# Patient Record
Sex: Male | Born: 1950 | Race: White | Hispanic: No | Marital: Single | Smoking: Former smoker
Health system: Southern US, Community
[De-identification: ages and names within clinical notes are randomized; demographics above are authoritative.]

## PROBLEM LIST (undated history)

## (undated) DIAGNOSIS — J449 Chronic obstructive pulmonary disease, unspecified: Secondary | ICD-10-CM

## (undated) HISTORY — PX: EXTERNAL EAR SURGERY: SHX627

## (undated) HISTORY — PX: TONSILLECTOMY: SUR1361

---

## 1998-12-04 DIAGNOSIS — F1021 Alcohol dependence, in remission: Secondary | ICD-10-CM | POA: Insufficient documentation

## 2014-03-14 ENCOUNTER — Inpatient Hospital Stay: Payer: Self-pay | Admitting: Internal Medicine

## 2014-03-14 LAB — BASIC METABOLIC PANEL
Anion Gap: 9 (ref 7–16)
BUN: 11 mg/dL (ref 7–18)
CO2: 28 mmol/L (ref 21–32)
Calcium, Total: 8.6 mg/dL (ref 8.5–10.1)
Chloride: 103 mmol/L (ref 98–107)
Creatinine: 0.93 mg/dL (ref 0.60–1.30)
EGFR (African American): >60
Glucose: 124 mg/dL — ABNORMAL HIGH (ref 65–99)
OSMOLALITY: 280 (ref 275–301)
Potassium: 3.6 mmol/L (ref 3.5–5.1)
Sodium: 140 mmol/L (ref 136–145)

## 2014-03-14 LAB — CBC
HCT: 46.1 % (ref 40.0–52.0)
HGB: 15.3 g/dL (ref 13.0–18.0)
MCH: 31.7 pg (ref 26.0–34.0)
MCHC: 33.1 g/dL (ref 32.0–36.0)
MCV: 96 fL (ref 80–100)
Platelet: 207 10*3/uL (ref 150–440)
RBC: 4.82 10*6/uL (ref 4.40–5.90)
RDW: 14.2 % (ref 11.5–14.5)
WBC: 8.2 10*3/uL (ref 3.8–10.6)

## 2014-03-14 LAB — INFLUENZA A,B,H1N1 - PCR (ARMC)
H1N1FLUPCR: NOT DETECTED
INFLAPCR: NEGATIVE
INFLBPCR: NEGATIVE

## 2014-03-14 LAB — D-DIMER(ARMC): D-Dimer: 367 ng/ml

## 2014-03-14 LAB — TROPONIN I

## 2014-03-15 LAB — BASIC METABOLIC PANEL
Anion Gap: 5 — ABNORMAL LOW (ref 7–16)
BUN: 12 mg/dL (ref 7–18)
CALCIUM: 8.9 mg/dL (ref 8.5–10.1)
Chloride: 104 mmol/L (ref 98–107)
Co2: 29 mmol/L (ref 21–32)
Creatinine: 0.92 mg/dL (ref 0.60–1.30)
EGFR (African American): >60
Glucose: 148 mg/dL — ABNORMAL HIGH (ref 65–99)
Osmolality: 278 (ref 275–301)
Potassium: 4.5 mmol/L (ref 3.5–5.1)
Sodium: 138 mmol/L (ref 136–145)

## 2014-03-15 LAB — CBC WITH DIFFERENTIAL/PLATELET
BASOS ABS: 0 10*3/uL (ref 0.0–0.1)
Basophil %: 0.1 %
EOS ABS: 0 10*3/uL (ref 0.0–0.7)
EOS PCT: 0 %
HCT: 44.3 % (ref 40.0–52.0)
HGB: 14.7 g/dL (ref 13.0–18.0)
LYMPHS PCT: 7.7 %
Lymphocyte #: 0.7 10*3/uL — ABNORMAL LOW (ref 1.0–3.6)
MCH: 31.8 pg (ref 26.0–34.0)
MCHC: 33.1 g/dL (ref 32.0–36.0)
MCV: 96 fL (ref 80–100)
MONO ABS: 0.4 x10 3/mm (ref 0.2–1.0)
MONOS PCT: 4.8 %
Neutrophil #: 7.8 10*3/uL — ABNORMAL HIGH (ref 1.4–6.5)
Neutrophil %: 87.4 %
Platelet: 210 10*3/uL (ref 150–440)
RBC: 4.62 10*6/uL (ref 4.40–5.90)
RDW: 14.7 % — ABNORMAL HIGH (ref 11.5–14.5)
WBC: 8.9 10*3/uL (ref 3.8–10.6)

## 2014-03-19 LAB — CULTURE, BLOOD (SINGLE)

## 2014-07-11 NOTE — H&P (Signed)
PATIENT NAME:  Gene Kim, Gene Kim MR#:  [id number] DATE OF BIRTH:  1950-08-27  DATE OF ADMISSION:  03/14/2014  PRIMARY CARE PROVIDER: [facility].   EMERGENCY DEPARTMENT REFERRING: Garnette Scheuermann, MD   CHIEF COMPLAINT: Shortness of breath, wheezing.   HISTORY OF PRESENT ILLNESS: The patient is a 64 year old white male with history of asthma who reports that his coworkers were sick with some upper respiratory symptoms a few days prior. Subsequently, he started developing headache, congestion and drainage, and then started having shortness of breath and wheezing since this morning. The patient continued to have significant shortness of breath and wheezing, came to the ED, received multiple treatments with nebulizers and has not improved. He continues to have shortness of breath. He does have history of asthma. He states that he uses the inhalers once in a while, but not on a daily basis. He also has a nebulizer machine, which he used a few times without any improvement in his symptoms.   PAST MEDICAL HISTORY: History of asthma since childhood.   PAST SURGICAL HISTORY: Left ear surgery.   ALLERGIES: None.   MEDICATIONS: He is on ProAir 2 puffs q. 6 as needed, as well as albuterol ipratropium nebulizers q. 6 p.r.n.   SOCIAL HISTORY: History of smoking, quit multiple years ago. He states that he only smoked heavly  for 5 years on a daily basis. Drinks occasionally, no drug use.   FAMILY HISTORY: Positive for hypertension.   REVIEW OF SYSTEMS: CONSTITUTIONAL: Complains of some fever. No weakness. No weight loss. No weight gain.  EYES: No blurred or double vision. No redness. No inflammation.  EARS, NOSE, AND THROAT: No tinnitus. No ear pain. No hearing loss. Complains of sinus pain.  RESPIRATORY: Complains of cough, wheezing. Has a history of asthma.  CARDIOVASCULAR: Denies any chest pain, orthopnea, edema, arrhythmia.  GASTROINTESTINAL: No nausea, vomiting, diarrhea. No abdominal  pain. No hematemesis. No melena. GENITOURINARY: Denies any dysuria, hematuria, renal colic or frequency.  ENDOCRINE: Denies any polyuria, nocturia or thyroid problems.  HEMATOLOGIC AND LYMPHATIC: Denies anemia, easy bruisability or bleeding.  SKIN: No acne. No rash.  MUSCULOSKELETAL: Denies any pain in the neck, back or shoulder. NEUROLOGICAL: No numbness, CVA, TIA or seizures.  PSYCHIATRIC: No anxiety, insomnia or ADD.   PHYSICAL EXAMINATION:  VITAL SIGNS: Temperature 98.6, heart rate 121, respirations 20, blood pressure 152/83.  GENERAL: The patient is a well-developed, well-nourished male in mild respiratory distress. HEENT: Head atraumatic, normocephalic. Pupils equally round and reactive to light and accommodation. LUNGS: Bilateral wheezing throughout both lungs with accessory muscle usage.  CARDIOVASCULAR: Tachycardic. No murmurs, rubs, clicks or gallops.  ABDOMEN: Soft, nontender, nondistended. Positive bowel sounds x 4.  EXTREMITIES: No clubbing, cyanosis, edema.  SKIN: No rash.  LYMPH NODES: Nonpalpable.  MUSCULOSKELETAL: There is no erythema or swelling.  VASCULAR: Good DP/PT pulses.  PSYCHIATRIC: Not anxious or depressed.  NEUROLOGIC: Awake, alert, oriented x 3. No focal deficits.   EVALUATIONS: Chest x-ray shows no acute abnormality. Glucose 124, BUN 11, creatinine 0.93, sodium 140, potassium 3.6, chloride 103, CO2 of 28, calcium 8.6. Troponin less than 0.02. WBC 8.2, hemoglobin 15.3, platelet count 207,000.   ASSESSMENT AND PLAN: The patient is a 64 year old white male with history of asthma who presents with cough, shortness of breath.  1.  Acute respiratory failure due to acute asthma exacerbation. At this time, I will treat him with Xopenex in light of his heart rate being high. I will place him on steroids, IV  antibiotics, antitussives. Will monitor his respiratory status.  2.  Acute bronchitis. We will give him IV antibiotics, antitussives. We will also check an  influenza.  3.  Miscellaneous. He will be on Lovenox for deep venous thrombosis prophylaxis.   TIME SPENT: Fifty minutes.    ____________________________ Lafonda Mosses Posey Pronto, MD shp:TT D: 03/14/2014 13:64:38 ET T: 03/14/2014 21:15:58 ET JOB#: [id number]  cc: Jahnyla Parrillo H. Posey Pronto, MD, <Dictator> Alric Seton MD ELECTRONICALLY SIGNED 03/18/2014 12:22

## 2014-07-11 NOTE — Discharge Summary (Signed)
PATIENT NAME:  Gene Kim, Gene Kim MR#:  [id number] DATE OF BIRTH:  May 01, 1950  DATE OF ADMISSION:  03/14/2014 DATE OF DISCHARGE:  03/15/2014  ADMITTING DIAGNOSES:  1.  Shortness of breath.  2.  Cough.   DISCHARGE DIAGNOSES:  1.  Acute respiratory failure due to status asthmaticus. Now patient's symptoms improved.  2.  Acute bronchitis, possible bacterial in nature.   CONSULTANTS: None.   PERTINENT LABORATORY DATA, EVALUATIONS: Chest x-ray showed no acute abnormality. Labs: Glucose 124, BUN 11, creatinine 0.93, sodium 140, potassium 3.6, chloride was 103, CO2 was 28. Troponin less than 0.02. WBC 8.2, hemoglobin was 15.3, platelet count was 207,000. Blood cultures: No growth. Influenza A and B were negative.   HOSPITAL COURSE: Please refer to H and P done by me on admission. The patient is a 64 year old white male with history of asthma, presented with upper respiratory symptoms ongoing for a few days with shortness of breath and wheezing. The patient came to the ED and received multiple doses of nebulizers without any significant improvement in his wheezing and shortness of breath. Therefore, we were asked to admit the patient. The patient was placed on nebulizers and steroids and antibiotics with significant improvement of his symptoms by day 2. He also had cough and symptoms consistent with acute bronchitis. The patient was treated with antibiotics. At this time he is doing much better and very anxious to go home.   DISCHARGE MEDICATIONS: ProAir 2 puffs q. 6 hours p.r.n., prednisone taper starting at 60 mg 10 mg until complete, guaifenesin 600 mg 1 tab p.o. b.i.d., codeine/guaifenesin 15 mL q. 6 hours p.r.n. for cough, fluticasone nasally 2 sprays daily, Levaquin 500 mg 1 tab p.o. q. 24 hours x 4 days, ipratropium 4 times a day as needed.   DISCHARGE DIET: Regular.   ACTIVITY: As tolerated.   FOLLOWUP: Follow with primary MD in 1 to 2 weeks.   TIME SPENT: 35  minutes.   ____________________________ Lafonda Mosses Posey Pronto, MD shp:ts D: 03/16/2014 20:47:00 ET T: 03/16/2014 21:04:15 ET JOB#: [id number]  cc: Jedd Schulenburg H. Posey Pronto, MD, <Dictator> Alric Seton MD ELECTRONICALLY SIGNED 03/18/2014 12:23

## 2014-08-27 ENCOUNTER — Encounter: Payer: Self-pay | Admitting: Emergency Medicine

## 2014-08-27 ENCOUNTER — Inpatient Hospital Stay
Admission: EM | Admit: 2014-08-27 | Discharge: 2014-08-28 | DRG: 189 | Disposition: A | Discharge status: Home / Self Care | Payer: BLUE CROSS/BLUE SHIELD | Attending: Internal Medicine | Admitting: Internal Medicine

## 2014-08-27 ENCOUNTER — Emergency Department: Payer: BLUE CROSS/BLUE SHIELD

## 2014-08-27 DIAGNOSIS — Z8249 Family history of ischemic heart disease and other diseases of the circulatory system: Secondary | ICD-10-CM | POA: Diagnosis not present

## 2014-08-27 DIAGNOSIS — W06XXXA Fall from bed, initial encounter: Secondary | ICD-10-CM | POA: Diagnosis present

## 2014-08-27 DIAGNOSIS — F17210 Nicotine dependence, cigarettes, uncomplicated: Secondary | ICD-10-CM | POA: Diagnosis present

## 2014-08-27 DIAGNOSIS — Z716 Tobacco abuse counseling: Secondary | ICD-10-CM | POA: Diagnosis present

## 2014-08-27 DIAGNOSIS — R55 Syncope and collapse: Secondary | ICD-10-CM | POA: Diagnosis present

## 2014-08-27 DIAGNOSIS — J441 Chronic obstructive pulmonary disease with (acute) exacerbation: Secondary | ICD-10-CM

## 2014-08-27 DIAGNOSIS — F10920 Alcohol use, unspecified with intoxication, uncomplicated: Secondary | ICD-10-CM

## 2014-08-27 DIAGNOSIS — F10129 Alcohol abuse with intoxication, unspecified: Secondary | ICD-10-CM | POA: Diagnosis present

## 2014-08-27 DIAGNOSIS — Z9889 Other specified postprocedural states: Secondary | ICD-10-CM | POA: Diagnosis not present

## 2014-08-27 DIAGNOSIS — H9190 Unspecified hearing loss, unspecified ear: Secondary | ICD-10-CM | POA: Diagnosis present

## 2014-08-27 DIAGNOSIS — J9601 Acute respiratory failure with hypoxia: Secondary | ICD-10-CM | POA: Diagnosis present

## 2014-08-27 DIAGNOSIS — R0902 Hypoxemia: Secondary | ICD-10-CM

## 2014-08-27 HISTORY — DX: Chronic obstructive pulmonary disease, unspecified: J44.9

## 2014-08-27 LAB — BLOOD GAS, ARTERIAL
Acid-base deficit: 5 mmol/L — ABNORMAL HIGH (ref 0.0–2.0)
BICARBONATE: 21.1 meq/L (ref 21.0–28.0)
Delivery systems: POSITIVE
EXPIRATORY PAP: 5
FIO2: 0.45 %
INSPIRATORY PAP: 10
O2 Saturation: 89.8 %
PATIENT TEMPERATURE: 37
PH ART: 7.31 — AB (ref 7.350–7.450)
pCO2 arterial: 42 mmHg (ref 32.0–48.0)
pO2, Arterial: 64 mmHg — ABNORMAL LOW (ref 83.0–108.0)

## 2014-08-27 LAB — CBC WITH DIFFERENTIAL/PLATELET
BASOS PCT: 0 %
Basophils Absolute: 0 10*3/uL (ref 0–0.1)
Eosinophils Absolute: 0.8 10*3/uL — ABNORMAL HIGH (ref 0–0.7)
Eosinophils Relative: 10 %
HEMATOCRIT: 47.5 % (ref 40.0–52.0)
HEMOGLOBIN: 15.6 g/dL (ref 13.0–18.0)
LYMPHS ABS: 4.1 10*3/uL — AB (ref 1.0–3.6)
LYMPHS PCT: 48 %
MCH: 31.6 pg (ref 26.0–34.0)
MCHC: 32.9 g/dL (ref 32.0–36.0)
MCV: 96 fL (ref 80.0–100.0)
MONO ABS: 0.7 10*3/uL (ref 0.2–1.0)
MONOS PCT: 8 %
Neutro Abs: 2.9 10*3/uL (ref 1.4–6.5)
Neutrophils Relative %: 34 %
Platelets: 179 10*3/uL (ref 150–440)
RBC: 4.95 MIL/uL (ref 4.40–5.90)
RDW: 13.8 % (ref 11.5–14.5)
WBC: 8.5 10*3/uL (ref 3.8–10.6)

## 2014-08-27 LAB — BASIC METABOLIC PANEL
ANION GAP: 10 (ref 5–15)
BUN: 14 mg/dL (ref 6–20)
CO2: 26 mmol/L (ref 22–32)
CREATININE: 1.03 mg/dL (ref 0.61–1.24)
Calcium: 8.7 mg/dL — ABNORMAL LOW (ref 8.9–10.3)
Chloride: 104 mmol/L (ref 101–111)
GFR calc Af Amer: >60 mL/min (ref >60)
GFR calc non Af Amer: >60 mL/min (ref >60)
GLUCOSE: 152 mg/dL — AB (ref 65–99)
Potassium: 4.3 mmol/L (ref 3.5–5.1)
Sodium: 140 mmol/L (ref 135–145)

## 2014-08-27 LAB — TROPONIN I: Troponin I: <0.03 ng/mL (ref <0.031)

## 2014-08-27 LAB — ETHANOL: ALCOHOL ETHYL (B): 227 mg/dL — AB (ref <5)

## 2014-08-27 LAB — MRSA PCR SCREENING: MRSA by PCR: POSITIVE — AB

## 2014-08-27 LAB — GLUCOSE, CAPILLARY: GLUCOSE-CAPILLARY: 129 mg/dL — AB (ref 65–99)

## 2014-08-27 MED ORDER — IPRATROPIUM-ALBUTEROL 0.5-2.5 (3) MG/3ML IN SOLN
3.0000 mL | Freq: Once | RESPIRATORY_TRACT | Status: AC
  Administered 2014-08-27: 3 mL via RESPIRATORY_TRACT

## 2014-08-27 MED ORDER — ACETAMINOPHEN 325 MG PO TABS
650.0000 mg | ORAL_TABLET | Freq: Four times a day (QID) | ORAL | Status: DC | PRN
Start: 2014-08-27 — End: 2014-08-28

## 2014-08-27 MED ORDER — MESALAMINE 1000 MG RE SUPP
1000.0000 mg | Freq: Every day | RECTAL | Status: DC
  Filled 2014-08-27 (×3): qty 1

## 2014-08-27 MED ORDER — IPRATROPIUM-ALBUTEROL 0.5-2.5 (3) MG/3ML IN SOLN
3.0000 mL | Freq: Four times a day (QID) | RESPIRATORY_TRACT | Status: DC
  Administered 2014-08-27 – 2014-08-28 (×3): 3 mL via RESPIRATORY_TRACT
  Filled 2014-08-27 (×3): qty 3

## 2014-08-27 MED ORDER — CHLORHEXIDINE GLUCONATE CLOTH 2 % EX PADS
6.0000 | MEDICATED_PAD | Freq: Every day | CUTANEOUS | Status: DC
  Administered 2014-08-28: 6 via TOPICAL

## 2014-08-27 MED ORDER — IPRATROPIUM BROMIDE 0.02 % IN SOLN
0.5000 mg | Freq: Four times a day (QID) | RESPIRATORY_TRACT | Status: DC
  Administered 2014-08-27: 0.5 mg via RESPIRATORY_TRACT
  Filled 2014-08-27: qty 2.5

## 2014-08-27 MED ORDER — ACETAMINOPHEN 650 MG RE SUPP
650.0000 mg | Freq: Four times a day (QID) | RECTAL | Status: DC | PRN
Start: 2014-08-27 — End: 2014-08-28

## 2014-08-27 MED ORDER — IPRATROPIUM-ALBUTEROL 0.5-2.5 (3) MG/3ML IN SOLN
RESPIRATORY_TRACT | Status: AC
  Filled 2014-08-27: qty 3

## 2014-08-27 MED ORDER — ENOXAPARIN SODIUM 40 MG/0.4ML ~~LOC~~ SOLN
40.0000 mg | SUBCUTANEOUS | Status: DC
  Administered 2014-08-27: 40 mg via SUBCUTANEOUS
  Filled 2014-08-27: qty 0.4

## 2014-08-27 MED ORDER — MUPIROCIN 2 % EX OINT
1.0000 "application " | TOPICAL_OINTMENT | Freq: Two times a day (BID) | CUTANEOUS | Status: DC
  Administered 2014-08-27 – 2014-08-28 (×3): 1 via NASAL
  Filled 2014-08-27: qty 22

## 2014-08-27 MED ORDER — SODIUM CHLORIDE 0.9 % IJ SOLN
3.0000 mL | Freq: Two times a day (BID) | INTRAMUSCULAR | Status: DC
  Administered 2014-08-27 – 2014-08-28 (×3): 3 mL via INTRAVENOUS

## 2014-08-27 MED ORDER — METHYLPREDNISOLONE SODIUM SUCC 40 MG IJ SOLR
40.0000 mg | Freq: Four times a day (QID) | INTRAMUSCULAR | Status: DC
  Administered 2014-08-27: 40 mg via INTRAVENOUS
  Filled 2014-08-27: qty 1

## 2014-08-27 MED ORDER — ENOXAPARIN SODIUM 40 MG/0.4ML ~~LOC~~ SOLN: 40.0000 mg | SUBCUTANEOUS | Status: DC

## 2014-08-27 MED ORDER — METHYLPREDNISOLONE SODIUM SUCC 40 MG IJ SOLR
40.0000 mg | Freq: Two times a day (BID) | INTRAMUSCULAR | Status: DC
  Administered 2014-08-27 – 2014-08-28 (×2): 40 mg via INTRAVENOUS
  Filled 2014-08-27 (×2): qty 1

## 2014-08-27 NOTE — Progress Notes (Addendum)
[place] at [place] NAME: Gene Kim    MR#:  [id number]  DATE OF BIRTH:  06-10-1950  SUBJECTIVE:  CHIEF COMPLAINT:   Chief Complaint  Patient presents with  . Altered Mental Status    Pt. found unresponsive   feeling much better, has difficulty hearing.  Feels his symptoms were started due to working very hard outside in hot weather.  REVIEW OF SYSTEMS:  Review of Systems  Constitutional: Negative for fever, weight loss, malaise/fatigue and diaphoresis.  HENT: Negative for ear discharge, ear pain, hearing loss, nosebleeds, sore throat and tinnitus.   Eyes: Negative for blurred vision and pain.  Respiratory: Positive for cough and shortness of breath. Negative for hemoptysis and wheezing.   Cardiovascular: Negative for chest pain, palpitations, orthopnea and leg swelling.  Gastrointestinal: Negative for heartburn, nausea, vomiting, abdominal pain, diarrhea, constipation and blood in stool.  Genitourinary: Negative for dysuria, urgency and frequency.  Musculoskeletal: Negative for myalgias and back pain.  Skin: Negative for itching and rash.  Neurological: Negative for dizziness, tingling, tremors, focal weakness, seizures, weakness and headaches.  Psychiatric/Behavioral: Negative for depression. The patient is not nervous/anxious.    DRUG ALLERGIES:  No Known Allergies  VITALS:  Blood pressure 115/72, pulse 84, temperature 96.8 F (36 C), temperature source Axillary, resp. rate 14, height 5\' 9"  (1.753 m), weight 60.6 kg (133 lb 9.6 oz), SpO2 99 %.  PHYSICAL EXAMINATION:  Physical Exam  Constitutional: He is oriented to person, place, and time and well-developed, well-nourished, and in no distress.  HENT:  Head: Normocephalic and atraumatic.  Eyes: Conjunctivae and EOM are normal. Pupils are equal, round, and reactive to light.  Neck: Normal range of motion. Neck supple. No tracheal deviation present. No thyromegaly  present.  Cardiovascular: Normal rate, regular rhythm and normal heart sounds.   Pulmonary/Chest: Effort normal and breath sounds normal. No respiratory distress. He has no wheezes. He exhibits no tenderness.  Abdominal: Soft. Bowel sounds are normal. He exhibits no distension. There is no tenderness.  Musculoskeletal: Normal range of motion.  Neurological: He is alert and oriented to person, place, and time. No cranial nerve deficit.  Skin: Skin is warm and dry. No rash noted.  Psychiatric: Mood and affect normal.   LABORATORY PANEL:   CBC  Recent Labs Lab 08/27/14 0446  WBC 8.5  HGB 15.6  HCT 47.5  PLT 179   ------------------------------------------------------------------------------------------------------------------  Chemistries   Recent Labs Lab 08/27/14 0446  NA 140  K 4.3  CL 104  CO2 26  GLUCOSE 152*  BUN 14  CREATININE 1.03  CALCIUM 8.7*    RADIOLOGY:  Dg Chest Port 1 View  08/27/2014   CLINICAL DATA:  Cough. Patient fell to the floor unresponsive. History of asthma and COPD.  EXAM: PORTABLE CHEST - 1 VIEW  COMPARISON:  03/14/2014  FINDINGS: Hyperinflation likely due to emphysema. Previous resection or resorption of the distal left clavicle. The heart size and mediastinal contours are within normal limits. Both lungs are clear. The visualized skeletal structures are unremarkable.  IMPRESSION: No evidence of active pulmonary disease. Emphysematous changes in the lungs.   Electronically Signed   By: Lucienne Capers M.D.   On: 08/27/2014 05:06   ASSESSMENT AND PLAN:   1. Acute respiratory failure with hypoxia secondary to COPD exacerbation: continue O2 supplementation via nasal cannula, wean off O2 as tolerated. 2. COPD exacerbation: DuoNeb nebs, O2 supplementation, IV Solu-Medrol. 3. Syncopal episode following vigorous coughing spell-vasovagal  secondary to cough. Doubt cardiac event 4. Tobacco use. Counseled to quit for 3 minutes. Patient not motivated to  quit at this time.   All the records are reviewed and case discussed with Care Management/Social Workerr. Management plans discussed with the patient, family and they are in agreement.  CODE STATUS: Full code  TOTAL TIME TAKING CARE OF THIS PATIENT: 35 minutes.   Greater than 50% of time spent on counseling and coordinating care of the patient.  POSSIBLE D/C IN 1-2 DAYS, DEPENDING ON CLINICAL CONDITION.  Likely tomorrow   Max Sane M.D on 08/27/2014 at 12:02 PM  Between 7am to 6pm - Pager - [phone]  After 6pm go to [email or web address] - password [email or web address] [facility]  [facility]  Office  [phone]  CC: Primary care physician; No PCP Per Patient

## 2014-08-27 NOTE — Care Management Note (Signed)
Case Management Note  Patient Details  Name: Gene Kim MRN: [id number] Date of Birth: [birth year]  Subjective/Objective:    RNCM consult  for discharge planning. Chart reviewed and met with patient at bedside. Admitted with acute respiratory failure,  hypoxia secondary to COPD exacerbation. Patient states he mowed his yard yesterday in the heat then drank approximately 6 beers. Patient reports he had a coughing episode and ask his girlfriend to get his inhaler. He was found unresponsive and hypoxic by the girlfriend beside the  bed. Patient lives alone. He works a full time job at [employer].  He does not drive but denies issues obtaining transportation. Patient reports he is seen at the [facility], last seen 2-3 months ago. His only medications are ProAir and neb treatments PRN. Gene Kim assists with medications. Patient reports the uses his medications PRN.  Denies home O2 use.  Currently off bipap and on 3L [place]. No needs anticipated at this time. Will follow.       Action/Plan:   Expected Discharge Date:                  Expected Discharge Plan:     In-House Referral:     Discharge planning Services     Post Acute Care Choice:    Choice offered to:     DME Arranged:    DME Agency:     HH Arranged:    [place] Agency:     Status of Service:     Medicare Important Message Given:    Date Medicare IM Given:    Medicare IM give by:    Date Additional Medicare IM Given:    Additional Medicare Important Message give by:     If discussed at [place] of Stay Meetings, dates discussed:    Additional Comments:  Jolly Mango, RN 08/27/2014, 10:13 AM

## 2014-08-27 NOTE — ED Notes (Signed)
Pt. Here via EMS from home.  Pt. Confused upon arrival, alert to self and place.  EMS reports person on scene stated pt. Had gotten out of bed coughed and fell to floor.  Pt. Was unresponsive upon arrival.  Pt. Was bagged given 3 douneb treatments and 125 solumedol.

## 2014-08-27 NOTE — H&P (Signed)
[place] at [place] NAME: Gene Kim    MR#:  [id number]  DATE OF BIRTH:  08/28/1950  DATE OF ADMISSION:  08/27/2014  PRIMARY CARE PHYSICIAN: No PCP Per Patient   REQUESTING/REFERRING PHYSICIAN: Lurline Hare  CHIEF COMPLAINT:   Chief Complaint  Patient presents with  . Altered Mental Status    Pt. found unresponsive   coughing spell that made him fall out of bed, became unresponsive Cough and shortness of breath  HISTORY OF PRESENT ILLNESS:  Gene Kim  is a 64 y.o. male with a known history of COPD was brought in by the EMS to the ED with the complaints of found unresponsive, hypoxic following a coughing spell. Patient states that while he was sitting in his bed he developed a vigorous coughing spell and shortness of breath following which he fell out of bed and was found unresponsive by his friend on the floor. EMS was called on-site who found the patient unresponsive and was hypoxic with room air oxygen in mid 80s. EMS gave him 3 rounds of DuoNeb's, IV Solu-Medrol en route to the ED and brought to the emergency room for further evaluation. On arrival to the ED, patient was found to be alert awake but with mild disorientation and was also noted to be hypoxic with O2 sats of 86% on nasal cannula. Patient was placed on BiPAP and additional round of DuoNeb was given. Patient is alert awake and oriented 3 and O2 sats are being maintained in the mid 90s on BiPAP and patient denies any complaints at this time. No history of any chest pain prior to the syncopal episode.  PAST MEDICAL HISTORY:   Past Medical History  Diagnosis Date  . COPD (chronic obstructive pulmonary disease)     PAST SURGICAL HISTORY:   Past Surgical History  Procedure Laterality Date  . External ear surgery Left     SOCIAL HISTORY:   History  Substance Use Topics  . Smoking status: Current Every Day Smoker -- 0.50 packs/day  . Smokeless  tobacco: Not on file  . Alcohol Use: 4.8 oz/week    8 Cans of beer per week    FAMILY HISTORY:   Family History  Problem Relation Age of Onset  . Heart disease Father     DRUG ALLERGIES:  No Known Allergies  REVIEW OF SYSTEMS:   Review of Systems  Constitutional: Negative for fever, chills and malaise/fatigue.  HENT: Negative for ear pain, hearing loss, nosebleeds, sore throat and tinnitus.   Eyes: Negative for blurred vision, double vision, pain, discharge and redness.  Respiratory: Positive for cough, shortness of breath and wheezing. Negative for hemoptysis and sputum production.   Cardiovascular: Negative for chest pain, palpitations, orthopnea and leg swelling.       Episode of syncope following a coughing spell as noted in history of present illness.  Gastrointestinal: Negative for nausea, vomiting, abdominal pain, diarrhea, constipation, blood in stool and melena.  Genitourinary: Negative for dysuria, urgency, frequency and hematuria.  Musculoskeletal: Negative for back pain, joint pain and neck pain.  Skin: Negative for itching and rash.  Neurological: Negative for dizziness, tingling, sensory change, focal weakness and seizures.  Endo/Heme/Allergies: Does not bruise/bleed easily.  Psychiatric/Behavioral: Negative for depression. The patient is not nervous/anxious.     MEDICATIONS AT HOME:   Prior to Admission medications   Not on File      VITAL SIGNS:  Blood pressure 128/85, pulse 98, resp. rate 14,  height 5\' 9"  (1.753 m), weight 65.772 kg (145 lb), SpO2 94 %.  PHYSICAL EXAMINATION:  Physical Exam  Constitutional: He is oriented to person, place, and time. He appears well-developed and well-nourished. No distress.  Pain BiPAP mask  HENT:  Head: Normocephalic and atraumatic.  Right Ear: External ear normal.  Left Ear: External ear normal.  Nose: Nose normal.  Mouth/Throat: Oropharynx is clear and moist. No oropharyngeal exudate.  Eyes: EOM are normal.  Pupils are equal, round, and reactive to light. No scleral icterus.  Neck: Normal range of motion. Neck supple. No JVD present. No thyromegaly present.  Cardiovascular: Normal rate, regular rhythm, normal heart sounds and intact distal pulses.  Exam reveals no friction rub.   No murmur heard. Respiratory: Effort normal. No respiratory distress. He has wheezes. He has no rales. He exhibits no tenderness.  GI: Soft. Bowel sounds are normal. He exhibits no distension and no mass. There is no tenderness. There is no rebound and no guarding.  Musculoskeletal: Normal range of motion. He exhibits no edema.  Lymphadenopathy:    He has no cervical adenopathy.  Neurological: He is alert and oriented to person, place, and time. He has normal reflexes. He displays normal reflexes. No cranial nerve deficit. He exhibits normal muscle tone.  Skin: Skin is warm. No rash noted. No erythema.  Psychiatric: He has a normal mood and affect. His behavior is normal. Thought content normal.   LABORATORY PANEL:   CBC  Recent Labs Lab 08/27/14 0446  WBC 8.5  HGB 15.6  HCT 47.5  PLT 179   ------------------------------------------------------------------------------------------------------------------  Chemistries   Recent Labs Lab 08/27/14 0446  NA 140  K 4.3  CL 104  CO2 26  GLUCOSE 152*  BUN 14  CREATININE 1.03  CALCIUM 8.7*   ------------------------------------------------------------------------------------------------------------------  Cardiac Enzymes  Recent Labs Lab 08/27/14 0446  TROPONINI <0.03   ------------------------------------------------------------------------------------------------------------------  RADIOLOGY:  Dg Chest Port 1 View  08/27/2014   CLINICAL DATA:  Cough. Patient fell to the floor unresponsive. History of asthma and COPD.  EXAM: PORTABLE CHEST - 1 VIEW  COMPARISON:  03/14/2014  FINDINGS: Hyperinflation likely due to emphysema. Previous resection or  resorption of the distal left clavicle. The heart size and mediastinal contours are within normal limits. Both lungs are clear. The visualized skeletal structures are unremarkable.  IMPRESSION: No evidence of active pulmonary disease. Emphysematous changes in the lungs.   Electronically Signed   By: Lucienne Capers M.D.   On: 08/27/2014 05:06    EKG:   Orders placed or performed during the hospital encounter of 08/27/14  . ED EKG sinus tachycardia with ventricular rate of 10 3 bpm. No acute ST-T changes.   . ED EKG    IMPRESSION AND PLAN:   1. Acute respiratory failure with hypoxia secondary to COPD exacerbation. Plan: Admit to stepdown unit, continue O2 supplementation through BiPAP, follow-up O2 sats, wean off O2 as tolerated. 2. COPD exacerbation. Plan: Vigorous DuoNeb nebs, O2 supplementation, IV Solu-Medrol. 3. Syncopal episode following vigorous coughing spell-vasovagal secondary to cough. Doubt cardiac event. Plan: Telemetry monitoring, cycle cardiac enzymes. Consider further workup including cardiology evaluation and echocardiogram if needed. 4. Tobacco use. Counseled to quit. Patient not motivated to quit at this time.    All the records are reviewed and case discussed with ED provider. Management plans discussed with the patient, family and they are in agreement.  CODE STATUS: Full code TOTAL TIME TAKING CARE OF THIS PATIENT: 50 minutes.    Asal Teas,  Ulice Brilliant M.D on 08/27/2014 at 6:30 AM  Between 7am to 6pm - Pager - [phone]  After 6pm go to [email or web address] - password [email or web address]  [facility]  Office  [phone]  CC: Primary care physician; No PCP Per Patient

## 2014-08-27 NOTE — ED Notes (Signed)
Report received from [place], pt has bipap in place, sleeping soundly at present, NSR on monitor, skin warm and dry, o2 sat 97%

## 2014-08-27 NOTE — Progress Notes (Signed)
Pt icu monitors removed and pt transferred to floor per orders. Pt vss. Pt denies pain. No signs or symptoms of distress.

## 2014-08-27 NOTE — ED Provider Notes (Signed)
[facility] Emergency Department Provider Note  ____________________________________________  Time seen: Approximately 4:22 AM  I have reviewed the triage vital signs and the nursing notes.   HISTORY  Chief Complaint Shortness of breath  Limited by intoxication  HPI Gene Kim is a 64 y.o. male who presents via EMS from home for shortness of breath. EMS reports they were originally called out for patient unresponsive. Male at the house stated patient got out of bed, coughed and felt to the floor. EMS reports patient was unresponsive upon their arrival with room air saturations 65%. Patient was briefly bagged, even 3 DuoNeb treatments in route and 125 mg IV Solu-Medrol. Patient does not remember events. Denies fever, chills, chest pain, abdominal pain, vomiting, headache, weakness. Arrives with diarrheal stool over his clothes.   Past Medical History  Diagnosis Date  . COPD (chronic obstructive pulmonary disease)     Patient Active Problem List   Diagnosis Date Noted  . Acute respiratory failure with hypoxia 08/27/2014  . COPD exacerbation 08/27/2014  . Syncope, vasovagal 08/27/2014    Past Surgical History  Procedure Laterality Date  . External ear surgery Left     No current outpatient prescriptions on file.  Allergies Review of patient's allergies indicates no known allergies.  Family History  Problem Relation Age of Onset  . Heart disease Father     Social History History  Substance Use Topics  . Smoking status: Current Every Day Smoker -- 0.50 packs/day  . Smokeless tobacco: Not on file  . Alcohol Use: 4.8 oz/week    8 Cans of beer per week  Smoker Recent alcohol use Denies illicit drugs  Review of Systems Constitutional: No fever/chills Eyes: No visual changes. ENT: No sore throat. Cardiovascular: Denies chest pain. Respiratory: Positive for shortness of breath. Gastrointestinal: No abdominal pain.  No  nausea, no vomiting.  No diarrhea.  No constipation. Genitourinary: Negative for dysuria. Musculoskeletal: Negative for back pain. Skin: Negative for rash. Neurological: Negative for headaches, focal weakness or numbness.  10-point ROS otherwise negative.  ____________________________________________   PHYSICAL EXAM:  VITAL SIGNS: ED Triage Vitals  Enc Vitals Group     BP --      Pulse --      Resp --      Temp --      Temp src --      SpO2 --      Weight --      Height --      Head Cir --      Peak Flow --      Pain Score --      Pain Loc --      Pain Edu? --      Excl. in [place]? --     Constitutional: Alert and oriented to place and self. Ill appearing and in moderate acute distress. Eyes: Conjunctivae are normal. PERRL. EOMI. Head: Atraumatic. Nose: No congestion/rhinnorhea. Mouth/Throat: Mucous membranes are moist.  Oropharynx non-erythematous. Neck: No stridor.   Cardiovascular: Tachycardic, regular rhythm. Grossly normal heart sounds.  Good peripheral circulation. Respiratory: Tachypneic, diminished breath sounds bilaterally with scattered wheezes. Gastrointestinal: Soft and nontender. No distention. No abdominal bruits. No CVA tenderness. Musculoskeletal: No lower extremity tenderness nor edema.  No joint effusions. Neurologic:  Intoxicated, follows commands slowly but intact. Normal speech. No focal neurologic deficits noted. Skin:  Skin is warm, dry and intact. No rash noted. Psychiatric: Mood and affect are normal. Speech and behavior are normal.  ____________________________________________  LABS (all labs ordered are listed, but only abnormal results are displayed)  Labs Reviewed  CBC WITH DIFFERENTIAL/PLATELET - Abnormal; Notable for the following:    Lymphs Abs 4.1 (*)    Eosinophils Absolute 0.8 (*)    All other components within normal limits  BASIC METABOLIC PANEL - Abnormal; Notable for the following:    Glucose, Bld 152 (*)    Calcium 8.7 (*)     All other components within normal limits  BLOOD GAS, ARTERIAL - Abnormal; Notable for the following:    pH, Arterial 7.31 (*)    pO2, Arterial 64 (*)    Acid-base deficit 5.0 (*)    All other components within normal limits  ETHANOL - Abnormal; Notable for the following:    Alcohol, Ethyl (B) 227 (*)    All other components within normal limits  TROPONIN I   ____________________________________________  EKG  ED ECG REPORT I, Diannie Willner J, the attending physician, personally viewed and interpreted this ECG.   Date: 08/27/2014  EKG Time: 0426  Rate: 106  Rhythm: sinus tachycardia  Axis: Normal  Intervals:none  ST&T Change: Nonspecific  ____________________________________________  RADIOLOGY  Portable chest x-ray (viewed by me, interpreted by Dr. Gerilyn Nestle): No evidence of active pulmonary disease. Emphysematous changes in the lungs. ____________________________________________   PROCEDURES  Procedure(s) performed: None  Critical Care performed: CRITICAL CARE Performed by: Paulette Blanch   Total critical care time:  30 minutes  Critical care time was exclusive of separately billable procedures and treating other patients.  Critical care was necessary to treat or prevent imminent or life-threatening deterioration.  Critical care was time spent personally by me on the following activities: development of treatment plan with patient and/or surrogate as well as nursing, discussions with consultants, evaluation of patient's response to treatment, examination of patient, obtaining history from patient or surrogate, ordering and performing treatments and interventions, ordering and review of laboratory studies, ordering and review of radiographic studies, pulse oximetry and re-evaluation of patient's condition.  ____________________________________________   INITIAL IMPRESSION / ASSESSMENT AND PLAN / ED COURSE  Pertinent labs & imaging results that were available during  my care of the patient were reviewed by me and considered in my medical decision making (see chart for details).  64 year old male with a history of COPD who presents for exacerbation with hypoxemia. On 3 L nasal cannula patient is only 86%. Will place patient on BiPAP. Continue nebulizer treatments, check basic lab work, chest x-ray. Anticipate hospital admission.  ----------------------------------------- 6:07 AM on 08/27/2014 -----------------------------------------  Patient improving on BiPAP. Resting comfortably in no acute distress. I have discussed case with Dr. Reece Levy who will evaluate patient in the ED for hospital admission. ____________________________________________   FINAL CLINICAL IMPRESSION(S) / ED DIAGNOSES  Final diagnoses:  Chronic obstructive pulmonary disease with acute exacerbation  Hypoxemia  Alcohol intoxication, uncomplicated      Paulette Blanch, MD 08/27/14 [phone]

## 2014-08-27 NOTE — ED Notes (Signed)
Pt. Friend arrived into ED patients room and stated she called EMS.  Friend states she was spending night on couch, when pt. Walked into living room.  Friend states pt. Was asking for puffer(rescue inhaler).  Pt. Friend states they could not find rescue inhaler so the friend went to look for hers, when she returned pt. Had fallen into trash can and then to floor.  Friend called EMS.

## 2014-08-27 NOTE — Plan of Care (Signed)
Problem: Discharge Progression Outcomes Goal: Discharge plan in place and appropriate Outcome: Progressing Individualization: Pt has no significant history except for COPD. Goal: Other Discharge Outcomes/Goals Outcome: Progressing Progress toward goals: Pt is on RA. Pt independently ambulates.  Hemodymanically stable. Possible discharge tomorrow.

## 2014-08-27 NOTE — Plan of Care (Signed)
Problem: ICU Phase Progression Outcomes Goal: Flu/PneumoVaccines if indicated Outcome: Not Applicable Date Met:  22/63/33 Pt already received vaccine

## 2014-08-27 NOTE — Progress Notes (Signed)
Pt transitioned from bipap to 3l [place]

## 2014-08-27 NOTE — Progress Notes (Signed)
Pt on room air. o2 saturation 98-100%

## 2014-08-28 MED ORDER — MUPIROCIN 2 % EX OINT: 1.0000 "application " | TOPICAL_OINTMENT | Freq: Two times a day (BID) | CUTANEOUS | Status: DC

## 2014-08-28 MED ORDER — PREDNISONE 10 MG PO TABS: 10.0000 mg | ORAL_TABLET | Freq: Every day | ORAL | Status: DC

## 2014-08-28 MED ORDER — IPRATROPIUM-ALBUTEROL 20-100 MCG/ACT IN AERS: 1.0000 | INHALATION_SPRAY | Freq: Four times a day (QID) | RESPIRATORY_TRACT | Status: AC

## 2014-08-28 NOTE — Discharge Instructions (Signed)
Bronchospasm A bronchospasm is when the tubes that carry air in and out of your lungs (airways) spasm or tighten. During a bronchospasm it is hard to breathe. This is because the airways get smaller. A bronchospasm can be triggered by:  Allergies. These may be to animals, pollen, food, or mold.  Infection. This is a common cause of bronchospasm.  Exercise.  Irritants. These include pollution, cigarette smoke, strong odors, aerosol sprays, and paint fumes.  Weather changes.  Stress.  Being emotional. HOME CARE   Always have a plan for getting help. Know when to call your doctor and local emergency services (911 in the [place] Know where you can get emergency care.  Only take medicines as told by your doctor.  If you were prescribed an inhaler or nebulizer machine, ask your doctor how to use it correctly. Always use a spacer with your inhaler if you were given one.  Stay calm during an attack. Try to relax and breathe more slowly.  Control your home environment:  Change your heating and air conditioning filter at least once a month.  Limit your use of fireplaces and wood stoves.  Do not  smoke. Do not  allow smoking in your home.  Avoid perfumes and fragrances.  Get rid of pests (such as roaches and mice) and their droppings.  Throw away plants if you see mold on them.  Keep your house clean and dust free.  Replace carpet with wood, tile, or vinyl flooring. Carpet can trap dander and dust.  Use allergy-proof pillows, mattress covers, and box spring covers.  Wash bed sheets and blankets every week in hot water. Dry them in a dryer.  Use blankets that are made of polyester or cotton.  Wash hands frequently. GET HELP IF:  You have muscle aches.  You have chest pain.  The thick spit you spit or cough up (sputum) changes from clear or white to yellow, green, gray, or bloody.  The thick spit you spit or cough up gets thicker.  There are problems that may be related  to the medicine you are given such as:  A rash.  Itching.  Swelling.  Trouble breathing. GET HELP RIGHT AWAY IF:  You feel you cannot breathe or catch your breath.  You cannot stop coughing.  Your treatment is not helping you breathe better.  You have very bad chest pain. MAKE SURE YOU:   Understand these instructions.  Will watch your condition.  Will get help right away if you are not doing well or get worse. Document Released: 12/28/2008 Document Revised: 03/07/2013 Document Reviewed: 08/23/2012 ExitCare Patient Information 2015 [employer]. This information is not intended to replace advice given to you by your health care provider. Make sure you discuss any questions you have with your health care provider.  

## 2014-08-28 NOTE — Plan of Care (Signed)
Problem: Discharge Progression Outcomes Goal: Discharge plan in place and appropriate Individualization: Pt prefers to be called Gene Kim who lives at home alone. Hx of COPD, Hep C, & asthma controlled by home medications.  Low fall risk able to ambulate independently safely. Goal: Other Discharge Outcomes/Goals Plan of care progress to goals: 1. No c/o pain during the night  2. VSS, aferbile. Remains on RA with stable O2 sats. Bactroban given as scheduled for MRSA treatment 3. Pt independently ambulates and expresses desire to return home to work as soon as possible No fall/injury this shift. Will continue to assess.

## 2014-08-28 NOTE — Progress Notes (Signed)
MD making rounds. Discharge orders received. IV's removed. NSR 82. Telemetry monitor removed and given to Network engineer. Discharge paperwork provided, explained, signed and witnessed. No unanswered questions. Discharged via wheelchair by auxiliary staff. Belongings sent with patient and family.

## 2014-08-28 NOTE — Care Management (Signed)
Discharge to home today per Dr. Manuella Ghazi. Will go to [facility] for follow-up appointments. No follow-up needs identified. Friend/family will transport. Shelbie Ammons RN MSN Care Management [phone]

## 2014-08-28 NOTE — Discharge Summary (Signed)
Gene Kim at [place] NAME: Gene Kim    MR#:  [id number]  DATE OF BIRTH:  Nov 21, 1950  DATE OF ADMISSION:  08/27/2014 ADMITTING PHYSICIAN: Juluis Mire, MD  DATE OF DISCHARGE: 08/28/2014  PRIMARY CARE PHYSICIAN: [facility]   ADMISSION DIAGNOSIS:  Hypoxemia [R09.02] Chronic obstructive pulmonary disease with acute exacerbation [J44.1] Alcohol intoxication, uncomplicated [Z00.923]  DISCHARGE DIAGNOSIS:  Principal Problem:   Acute respiratory failure with hypoxia Active Problems:   COPD exacerbation   Syncope, vasovagal  SECONDARY DIAGNOSIS:   Past Medical History  Diagnosis Date  . COPD (chronic obstructive pulmonary disease)    HOSPITAL COURSE:  64 year old male with above-mentioned medical problem was admitted for being unresponsive.  He was found to have syncopal episode from vasovagal etiology.  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  He was initially admitted to ICU and subsequently transferred to the floor has been feeling much better.  Has been off oxygen and has been ambulating without any difficulty.  He was also counseled for tobacco cessation for about 3 minutes, not ready to quit yet.  He feels much better and is agreeable to discharge plan and is wanting to go today.  He is being discharged home in stable condition. DISCHARGE CONDITIONS:   Good  DRUG ALLERGIES:  No Known Allergies  DISCHARGE MEDICATIONS:   Current Discharge Medication List    START taking these medications   Details  Ipratropium-Albuterol (COMBIVENT RESPIMAT) 20-100 MCG/ACT AERS respimat Inhale 1 puff into the lungs every 6 (six) hours. Qty: 1 Inhaler, Refills: 0    mupirocin ointment (BACTROBAN) 2 % Place 1 application into the nose 2 (two) times daily. Qty: 22 g, Refills: 0       CONTINUE these medications which have NOT CHANGED   Details  mesalamine (CANASA) 1000 MG suppository Place 1,000 mg rectally at bedtime.        DIET:  Regular diet  DISCHARGE CONDITION:  Good  ACTIVITY:  Activity as tolerated  OXYGEN:  Home Oxygen: No.   Oxygen Delivery: room air  DISCHARGE LOCATION:  home   If you experience worsening of your admission symptoms, develop shortness of breath, life threatening emergency, suicidal or homicidal thoughts you must seek medical attention immediately by calling 911 or calling your MD immediately  if symptoms less severe.  You Must read complete instructions/literature along with all the possible adverse reactions/side effects for all the Medicines you take and that have been prescribed to you. Take any new Medicines after you have completely understood and accpet all the possible adverse reactions/side effects.   Please note  You were cared for by a hospitalist during your hospital stay. If you have any questions about your discharge medications or the care you received while you were in the hospital after you are discharged, you can call the unit and asked to speak with the hospitalist on call if the hospitalist that took care of you is not available. Once you are discharged, your primary care physician will handle any further medical issues. Please note that NO REFILLS for any discharge medications will be authorized once you are discharged, as it is imperative that you return to your primary care physician (or establish a relationship with a primary care physician if you do not have one) for your aftercare needs so that they can reassess your need  for medications and monitor your lab values.   On the day of Discharge:   VITAL SIGNS:  Blood pressure 110/61, pulse 75, temperature 97.8 F (36.6 C), temperature source Oral, resp. rate 16, height 5\' 9"  (1.753 m), weight 60.584 kg (133 lb 9 oz), SpO2 98 %.  I/O:   Intake/Output  Summary (Last 24 hours) at 08/28/14 0819 Last data filed at 08/27/14 1200  Gross per 24 hour  Intake      3 ml  Output    400 ml  Net   -397 ml    PHYSICAL EXAMINATION:  GENERAL:  64 y.o.-year-old patient lying in the bed with no acute distress.  EYES: Pupils equal, round, reactive to light and accommodation. No scleral icterus. Extraocular muscles intact.  HEENT: Head atraumatic, normocephalic. Oropharynx and nasopharynx clear.  NECK:  Supple, no jugular venous distention. No thyroid enlargement, no tenderness.  LUNGS: Normal breath sounds bilaterally, no wheezing, rales,rhonchi or crepitation. No use of accessory muscles of respiration.  CARDIOVASCULAR: S1, S2 normal. No murmurs, rubs, or gallops.  ABDOMEN: Soft, non-tender, non-distended. Bowel sounds present. No organomegaly or mass.  EXTREMITIES: No pedal edema, cyanosis, or clubbing.  NEUROLOGIC: Cranial nerves II through XII are intact. Muscle strength 5/5 in all extremities. Sensation intact. Gait not checked.  PSYCHIATRIC: The patient is alert and oriented x 3.  SKIN: No obvious rash, lesion, or ulcer.   DATA REVIEW:   CBC  Recent Labs Lab 08/27/14 0446  WBC 8.5  HGB 15.6  HCT 47.5  PLT 179    Chemistries   Recent Labs Lab 08/27/14 0446  NA 140  K 4.3  CL 104  CO2 26  GLUCOSE 152*  BUN 14  CREATININE 1.03  CALCIUM 8.7*   Microbiology Results  Results for orders placed or performed during the hospital encounter of 08/27/14  MRSA PCR Screening     Status: Abnormal   Collection Time: 08/27/14  8:52 AM  Result Value Ref Range Status   MRSA by PCR POSITIVE (A) NEGATIVE Final    Comment:        The GeneXpert MRSA Assay (FDA approved for NASAL specimens only), is one component of a comprehensive MRSA colonization surveillance program. It is not intended to diagnose MRSA infection nor to guide or monitor treatment for MRSA infections. CRITICAL RESULT CALLED TO, READ BACK BY AND VERIFIED WITH: JAMIE  SMITH GREGORY AT 2542 08/27/14 BY MSS.     RADIOLOGY:  Dg Chest Port 1 View  08/27/2014   CLINICAL DATA:  Cough. Patient fell to the floor unresponsive. History of asthma and COPD.  EXAM: PORTABLE CHEST - 1 VIEW  COMPARISON:  03/14/2014  FINDINGS: Hyperinflation likely due to emphysema. Previous resection or resorption of the distal left clavicle. The heart size and mediastinal contours are within normal limits. Both lungs are clear. The visualized skeletal structures are unremarkable.  IMPRESSION: No evidence of active pulmonary disease. Emphysematous changes in the lungs.   Electronically Signed   By: Lucienne Capers M.D.   On: 08/27/2014 05:06   Management plans discussed with the patient and he is in agreement.  CODE STATUS:     Code Status Orders        Start     Ordered   08/27/14 0905  Full code   Continuous     08/27/14 0904     TOTAL TIME TAKING CARE OF THIS PATIENT: 55 minutes.   [facility], Lillionna Nabi M.D on 08/28/2014 at 8:19 AM  Between 7am  to 6pm - Pager - [phone]  After 6pm go to [email or web address] - password [email or web address] [place] [facility]  Office  [phone]  CC: Primary care physician; [facility]

## 2014-09-11 LAB — GLUCOSE, CAPILLARY
Comment 1: 148
GLUCOSE-CAPILLARY: 148 mg/dL — AB (ref 65–99)

## 2015-05-25 ENCOUNTER — Encounter: Payer: Self-pay | Admitting: *Deleted

## 2015-05-25 ENCOUNTER — Emergency Department: Payer: BLUE CROSS/BLUE SHIELD

## 2015-05-25 ENCOUNTER — Inpatient Hospital Stay
Admission: EM | Admit: 2015-05-25 | Discharge: 2015-05-27 | DRG: 189 | Disposition: A | Discharge status: Home / Self Care | Payer: BLUE CROSS/BLUE SHIELD | Attending: Internal Medicine | Admitting: Internal Medicine

## 2015-05-25 DIAGNOSIS — Z87891 Personal history of nicotine dependence: Secondary | ICD-10-CM

## 2015-05-25 DIAGNOSIS — J9621 Acute and chronic respiratory failure with hypoxia: Principal | ICD-10-CM | POA: Diagnosis present

## 2015-05-25 DIAGNOSIS — J45901 Unspecified asthma with (acute) exacerbation: Secondary | ICD-10-CM

## 2015-05-25 DIAGNOSIS — J9622 Acute and chronic respiratory failure with hypercapnia: Secondary | ICD-10-CM | POA: Diagnosis present

## 2015-05-25 DIAGNOSIS — I10 Essential (primary) hypertension: Secondary | ICD-10-CM | POA: Diagnosis present

## 2015-05-25 DIAGNOSIS — J9601 Acute respiratory failure with hypoxia: Secondary | ICD-10-CM | POA: Diagnosis present

## 2015-05-25 DIAGNOSIS — J45909 Unspecified asthma, uncomplicated: Secondary | ICD-10-CM | POA: Diagnosis present

## 2015-05-25 DIAGNOSIS — Z79899 Other long term (current) drug therapy: Secondary | ICD-10-CM

## 2015-05-25 DIAGNOSIS — Z8249 Family history of ischemic heart disease and other diseases of the circulatory system: Secondary | ICD-10-CM

## 2015-05-25 DIAGNOSIS — R0600 Dyspnea, unspecified: Secondary | ICD-10-CM | POA: Diagnosis present

## 2015-05-25 DIAGNOSIS — J441 Chronic obstructive pulmonary disease with (acute) exacerbation: Secondary | ICD-10-CM | POA: Diagnosis present

## 2015-05-25 DIAGNOSIS — R0603 Acute respiratory distress: Secondary | ICD-10-CM

## 2015-05-25 LAB — BLOOD GAS, ARTERIAL
ACID-BASE EXCESS: 0.6 mmol/L (ref 0.0–3.0)
Bicarbonate: 31.7 mEq/L — ABNORMAL HIGH (ref 21.0–28.0)
DELIVERY SYSTEMS: POSITIVE
EXPIRATORY PAP: 6
FIO2: 0.45
Inspiratory PAP: 14
Mechanical Rate: 12
O2 Saturation: 97.3 %
PATIENT TEMPERATURE: 37
PCO2 ART: 83 mmHg — AB (ref 32.0–48.0)
PH ART: 7.19 — AB (ref 7.350–7.450)
pO2, Arterial: 113 mmHg — ABNORMAL HIGH (ref 83.0–108.0)

## 2015-05-25 LAB — COMPREHENSIVE METABOLIC PANEL
ALT: 17 U/L (ref 17–63)
AST: 24 U/L (ref 15–41)
Albumin: 4.1 g/dL (ref 3.5–5.0)
Alkaline Phosphatase: 87 U/L (ref 38–126)
Anion gap: 12 (ref 5–15)
BUN: 20 mg/dL (ref 6–20)
CHLORIDE: 107 mmol/L (ref 101–111)
CO2: 22 mmol/L (ref 22–32)
CREATININE: 0.63 mg/dL (ref 0.61–1.24)
Calcium: 8.8 mg/dL — ABNORMAL LOW (ref 8.9–10.3)
Glucose, Bld: 125 mg/dL — ABNORMAL HIGH (ref 65–99)
POTASSIUM: 4.2 mmol/L (ref 3.5–5.1)
Sodium: 141 mmol/L (ref 135–145)
TOTAL PROTEIN: 7.1 g/dL (ref 6.5–8.1)
Total Bilirubin: 0.7 mg/dL (ref 0.3–1.2)

## 2015-05-25 LAB — CBC
HCT: 43.6 % (ref 40.0–52.0)
Hemoglobin: 14.6 g/dL (ref 13.0–18.0)
MCH: 31.8 pg (ref 26.0–34.0)
MCHC: 33.4 g/dL (ref 32.0–36.0)
MCV: 95.1 fL (ref 80.0–100.0)
PLATELETS: 188 10*3/uL (ref 150–440)
RBC: 4.58 MIL/uL (ref 4.40–5.90)
RDW: 14.4 % (ref 11.5–14.5)
WBC: 11.7 10*3/uL — ABNORMAL HIGH (ref 3.8–10.6)

## 2015-05-25 LAB — CREATININE, SERUM
Creatinine, Ser: 0.68 mg/dL (ref 0.61–1.24)
GFR calc Af Amer: >60 mL/min (ref >60)
GFR calc non Af Amer: >60 mL/min (ref >60)

## 2015-05-25 LAB — TROPONIN I

## 2015-05-25 LAB — MRSA PCR SCREENING: MRSA BY PCR: NEGATIVE

## 2015-05-25 MED ORDER — ENOXAPARIN SODIUM 40 MG/0.4ML ~~LOC~~ SOLN
40.0000 mg | SUBCUTANEOUS | Status: DC
  Administered 2015-05-26: 40 mg via SUBCUTANEOUS
  Filled 2015-05-25 (×2): qty 0.4

## 2015-05-25 MED ORDER — METHYLPREDNISOLONE SODIUM SUCC 125 MG IJ SOLR
60.0000 mg | INTRAMUSCULAR | Status: DC
  Administered 2015-05-26: 60 mg via INTRAVENOUS
  Filled 2015-05-25: qty 2

## 2015-05-25 MED ORDER — ACETAMINOPHEN 650 MG RE SUPP: 650.0000 mg | Freq: Four times a day (QID) | RECTAL | Status: DC | PRN

## 2015-05-25 MED ORDER — AZITHROMYCIN 500 MG IV SOLR
500.0000 mg | Freq: Once | INTRAVENOUS | Status: AC
  Administered 2015-05-25: 500 mg via INTRAVENOUS
  Filled 2015-05-25: qty 500

## 2015-05-25 MED ORDER — MAGNESIUM SULFATE 2 GM/50ML IV SOLN
INTRAVENOUS | Status: AC
  Administered 2015-05-25: 2 g via INTRAVENOUS
  Filled 2015-05-25: qty 50

## 2015-05-25 MED ORDER — DEXTROSE 5 % IV SOLN: 500.0000 mg | INTRAVENOUS | Status: DC

## 2015-05-25 MED ORDER — IPRATROPIUM-ALBUTEROL 0.5-2.5 (3) MG/3ML IN SOLN
3.0000 mL | Freq: Once | RESPIRATORY_TRACT | Status: AC
  Administered 2015-05-25: 3 mL via RESPIRATORY_TRACT

## 2015-05-25 MED ORDER — METHYLPREDNISOLONE SODIUM SUCC 125 MG IJ SOLR
INTRAMUSCULAR | Status: AC
  Administered 2015-05-25: 125 mg via INTRAVENOUS
  Filled 2015-05-25: qty 2

## 2015-05-25 MED ORDER — DEXTROSE 5 % IV SOLN
500.0000 mg | INTRAVENOUS | Status: DC
  Administered 2015-05-26: 500 mg via INTRAVENOUS
  Filled 2015-05-25 (×2): qty 500

## 2015-05-25 MED ORDER — IPRATROPIUM-ALBUTEROL 0.5-2.5 (3) MG/3ML IN SOLN
3.0000 mL | RESPIRATORY_TRACT | Status: DC
  Administered 2015-05-25 – 2015-05-27 (×13): 3 mL via RESPIRATORY_TRACT
  Filled 2015-05-25 (×13): qty 3

## 2015-05-25 MED ORDER — METHYLPREDNISOLONE SODIUM SUCC 125 MG IJ SOLR
125.0000 mg | Freq: Once | INTRAMUSCULAR | Status: AC
  Administered 2015-05-25: 125 mg via INTRAVENOUS

## 2015-05-25 MED ORDER — MOMETASONE FURO-FORMOTEROL FUM 100-5 MCG/ACT IN AERO
2.0000 | INHALATION_SPRAY | Freq: Two times a day (BID) | RESPIRATORY_TRACT | Status: DC
  Administered 2015-05-25 – 2015-05-27 (×4): 2 via RESPIRATORY_TRACT
  Filled 2015-05-25: qty 8.8

## 2015-05-25 MED ORDER — ACETAMINOPHEN 325 MG PO TABS: 650.0000 mg | ORAL_TABLET | Freq: Four times a day (QID) | ORAL | Status: DC | PRN

## 2015-05-25 MED ORDER — ONDANSETRON HCL 4 MG PO TABS
4.0000 mg | ORAL_TABLET | Freq: Four times a day (QID) | ORAL | Status: DC | PRN
Start: 2015-05-25 — End: 2015-05-27

## 2015-05-25 MED ORDER — SODIUM CHLORIDE 0.9% FLUSH
3.0000 mL | Freq: Two times a day (BID) | INTRAVENOUS | Status: DC
  Administered 2015-05-26 – 2015-05-27 (×3): 3 mL via INTRAVENOUS

## 2015-05-25 MED ORDER — ONDANSETRON HCL 4 MG/2ML IJ SOLN: 4.0000 mg | Freq: Four times a day (QID) | INTRAMUSCULAR | Status: DC | PRN

## 2015-05-25 MED ORDER — MORPHINE SULFATE (PF) 2 MG/ML IV SOLN: 2.0000 mg | INTRAVENOUS | Status: DC | PRN

## 2015-05-25 MED ORDER — OXYCODONE HCL 5 MG PO TABS: 5.0000 mg | ORAL_TABLET | ORAL | Status: DC | PRN

## 2015-05-25 MED ORDER — MAGNESIUM SULFATE 2 GM/50ML IV SOLN
2.0000 g | Freq: Once | INTRAVENOUS | Status: AC
  Administered 2015-05-25: 2 g via INTRAVENOUS

## 2015-05-25 NOTE — ED Provider Notes (Signed)
[facility] Emergency Department Provider Note  Time seen: 6:49 AM  I have reviewed the triage vital signs and the nursing notes.   HISTORY  Chief Complaint Respiratory Distress    HPI Kyser Dale Conger is a 65 y.o. male with a past medical history of asthma presents the emergency department with trouble breathing. According to the patient around midnight tonight he began with shortness of breath which has progressively worsened. The patient has taken his nebulizer treatments without relief. Patient states they continue to worsen so he had to call EMS. EMS states significant respiratory distress, unable to obtain a pulse oximetry level. Patient brought to the emergency department emergency traffic. Patient states mild chest discomfort which she describes as tightness, states significant difficulty breathing. Denies any history of COPD. Denies smoking.    Past Medical History  Diagnosis Date  . COPD (chronic obstructive pulmonary disease)     Patient Active Problem List   Diagnosis Date Noted  . Acute respiratory failure with hypoxia ([place]) 08/27/2014  . COPD exacerbation ([place]) 08/27/2014  . Syncope, vasovagal 08/27/2014    Past Surgical History  Procedure Laterality Date  . External ear surgery Left     Current Outpatient Rx  Name  Route  Sig  Dispense  Refill  . Ipratropium-Albuterol (COMBIVENT RESPIMAT) 20-100 MCG/ACT AERS respimat   Inhalation   Inhale 1 puff into the lungs every 6 (six) hours.   1 Inhaler   0   . mesalamine (CANASA) 1000 MG suppository   Rectal   Place 1,000 mg rectally at bedtime.         . mupirocin ointment (BACTROBAN) 2 %   Nasal   Place 1 application into the nose 2 (two) times daily.   22 g   0   . predniSONE (DELTASONE) 10 MG tablet   Oral   Take 1 tablet (10 mg total) by mouth daily with breakfast.   21 tablet   0     Start 60 mg oral daily, taper 10 mg daily until fi ...      Allergies Review of patient's allergies indicates no known allergies.  Family History  Problem Relation Age of Onset  . Heart disease Father     Social History Social History  Substance Use Topics  . Smoking status: Current Every Day Smoker -- 0.50 packs/day  . Smokeless tobacco: Not on file  . Alcohol Use: 4.8 oz/week    8 Cans of beer per week    Review of Systems Constitutional: Negative for fever. Cardiovascular: Positive for chest tightness Respiratory: Positive for shortness of breath. Gastrointestinal: Negative for abdominal pain Neurological: Negative for headache 10-point ROS otherwise negative.  ____________________________________________   PHYSICAL EXAM:  VITAL SIGNS: ED Triage Vitals  Enc Vitals Group     BP --      Pulse --      Resp --      Temp --      Temp src --      SpO2 --      Weight --      Height --      Head Cir --      Peak Flow --      Pain Score --      Pain Loc --      Pain Edu? --      Excl. in [place]? --     Constitutional: Alert and oriented. Moderate respiratory distress, sitting upright in tripod positioning. Accessory muscle use.  Retractions noted. Eyes: Normal exam ENT   Head: Normocephalic and atraumatic.   Mouth/Throat: Mucous membranes are moist. Cardiovascular: Normal rate, regular rhythm.  Respiratory: Moderate respiratory distress. Patient has diffuse expiratory wheeze. Tachypnea. Gastrointestinal: Soft and nontender. No distention.  Musculoskeletal: Nontender with normal range of motion in all extremities. No lower extremity tenderness or edema. Neurologic:  Normal speech and language. No gross focal neurologic deficits Skin:  Skin is warm, dry and intact.  Psychiatric: Mood and affect are normal. Speech and behavior are normal.  ____________________________________________    EKG EKG viewed by myself shows sinus tachy at 125, normal axis, RsR most consistent with RBBB, nonspecific ST changes.     ____________________________________________    RADIOLOGY  No acute abnormality on CXR   CRITICAL CARE Performed by: Harvest Dark   Total critical care time: 30 minutes  Critical care time was exclusive of separately billable procedures and treating other patients.  Critical care was necessary to treat or prevent imminent or life-threatening deterioration.  Critical care was time spent personally by me on the following activities: development of treatment plan with patient and/or surrogate as well as nursing, discussions with consultants, evaluation of patient's response to treatment, examination of patient, obtaining history from patient or surrogate, ordering and performing treatments and interventions, ordering and review of laboratory studies, ordering and review of radiographic studies, pulse oximetry and re-evaluation of patient's condition.    ____________________________________________   INITIAL IMPRESSION / ASSESSMENT AND PLAN / ED COURSE  Pertinent labs & imaging results that were available during my care of the patient were reviewed by me and considered in my medical decision making (see chart for details).  Patient presents the emergency department emergency traffic respiratory distress. Patient states a history of asthma, denies any history of COPD. Patient has moderate respiratory distress at this time, sitting in a tripod position on the bed. Patient with retractions and accessory muscle use. Patient has significant expiratory wheeze in all lung fields. We will check labs, chest x-ray, begin treatment with DuoNeb's, Solu-Medrol, magnesium, and place the patient on BiPAP.  Patients ABG consistent with obstructive pathology with hypercarbia.  Will continue bipap and admit for asthma exacerbation.  Patient feeling better, but remains SOB on bipap.  ____________________________________________   FINAL CLINICAL IMPRESSION(S) / ED DIAGNOSES  Asthma  exacerbation   Harvest Dark, MD 05/25/15 [phone]

## 2015-05-25 NOTE — ED Notes (Signed)
Lee with resp. Called about giving duoneds through bipap. He will come and admin.

## 2015-05-25 NOTE — H&P (Signed)
Gene Kim NAME: Gene Kim    MR#:  [id number]  DATE OF BIRTH:  1950/04/28   DATE OF ADMISSION:  05/25/2015  PRIMARY CARE PHYSICIAN: No PCP Per Patient   REQUESTING/REFERRING PHYSICIAN: Paduchowski  CHIEF COMPLAINT:   Chief Complaint  Patient presents with  . Respiratory Distress   shortness of breath  HISTORY OF PRESENT ILLNESS:  Gene Kim  is a 65 y.o. male with a known history of COPD and non-oxygen requiring who is presenting with shortness of breath. He describes approximately two-week duration of symptoms including cough congestion started on doxycycline by PCP as well as a course of steroids without any improvement in symptoms. He now has one day duration of greatly worsening shortness of breath after completion of steroids. Brought to the Hospital further workup and evaluation. He was in respiratory distress unable to complete sentences requiring BiPAP therapy on arrival to the emergency department. After multiple rounds of steroids breathing treatments as rest or status somewhat improved however still remains on BiPAP therapy at this time.  PAST MEDICAL HISTORY:   Past Medical History  Diagnosis Date  . COPD (chronic obstructive pulmonary disease) ([place])     PAST SURGICAL HISTORY:   Past Surgical History  Procedure Laterality Date  . External ear surgery Left     SOCIAL HISTORY:   Social History  Substance Use Topics  . Smoking status: Former Smoker -- 0.50 packs/day    Types: Cigarettes  . Smokeless tobacco: Never Used  . Alcohol Use: 4.8 oz/week    8 Cans of beer per week     Comment: rarely    FAMILY HISTORY:   Family History  Problem Relation Age of Onset  . Heart disease Father     DRUG ALLERGIES:  No Known Allergies  REVIEW OF SYSTEMS:  REVIEW OF SYSTEMS:  CONSTITUTIONAL: Denies fevers, chills, fatigue, weakness.  EYES: Denies blurred vision, double vision, or  eye pain.  EARS, NOSE, THROAT: Denies tinnitus, ear pain, hearing loss.  RESPIRATORY: Positive cough, shortness of breath, wheezing  CARDIOVASCULAR: Denies chest pain, palpitations, edema.  GASTROINTESTINAL: Denies nausea, vomiting, diarrhea, abdominal pain.  GENITOURINARY: Denies dysuria, hematuria.  ENDOCRINE: Denies nocturia or thyroid problems. HEMATOLOGIC AND LYMPHATIC: Denies easy bruising or bleeding.  SKIN: Denies rash or lesions.  MUSCULOSKELETAL: Denies pain in neck, back, shoulder, knees, hips, or further arthritic symptoms.  NEUROLOGIC: Denies paralysis, paresthesias.  PSYCHIATRIC: Denies anxiety or depressive symptoms. Otherwise full review of systems performed by me is negative.   MEDICATIONS AT HOME:   Prior to Admission medications   Medication Sig Start Date End Date Taking? Authorizing Provider  albuterol (PROVENTIL HFA;VENTOLIN HFA) 108 (90 Base) MCG/ACT inhaler Inhale 2 puffs into the lungs every 4 (four) hours as needed for wheezing or shortness of breath.   Yes Historical Provider, MD  doxycycline (VIBRA-TABS) 100 MG tablet Take 100 mg by mouth 2 (two) times daily. 05/21/15  Yes Historical Provider, MD  ipratropium-albuterol (DUONEB) 0.5-2.5 (3) MG/3ML SOLN Inhale 3 mLs into the lungs every 6 (six) hours as needed. 05/21/15  Yes Historical Provider, MD  Ipratropium-Albuterol (COMBIVENT RESPIMAT) 20-100 MCG/ACT AERS respimat Inhale 1 puff into the lungs every 6 (six) hours. Patient not taking: Reported on 05/25/2015 08/28/14   Max Sane, MD  mupirocin ointment (BACTROBAN) 2 % Place 1 application into the nose 2 (two) times daily. Patient not taking: Reported on 05/25/2015 08/28/14   Max Sane, MD  predniSONE (DELTASONE)  10 MG tablet Take 1 tablet (10 mg total) by mouth daily with breakfast. Patient not taking: Reported on 05/25/2015 08/28/14   Max Sane, MD      VITAL SIGNS:  Blood pressure 157/96, pulse 127, temperature 97.5 F (36.4 C), temperature source Oral, resp.  rate 28, height 5\' 9"  (1.753 m), weight 65.772 kg (145 lb), SpO2 96 %.  PHYSICAL EXAMINATION:  VITAL SIGNS: Filed Vitals:   05/25/15 0715 05/25/15 0738  BP: 155/108 157/96  Pulse: 120 127  Temp:    Resp: 26 39   GENERAL:64 y.o.male currently in moderate acute distress. Given respiratory status requiring BiPAP HEAD: Normocephalic, atraumatic.  EYES: Pupils equal, round, reactive to light. Extraocular muscles intact. No scleral icterus.  MOUTH: Moist mucosal membrane. Dentition intact. No abscess noted.  EAR, NOSE, THROAT: Clear without exudates. No external lesions.  NECK: Supple. No thyromegaly. No nodules. No JVD.  PULMONARY: Diffuse expiratory wheezing diminished breath sounds at bases bilaterally scant rhonchi tachypnea without use of accessory muscles, Good respiratory effort. Poor air entry bilaterally-requiring BiPAP therapy CHEST: Nontender to palpation.  CARDIOVASCULAR: S1 and S2. Tachycardic No murmurs, rubs, or gallops. No edema. Pedal pulses 2+ bilaterally.  GASTROINTESTINAL: Soft, nontender, nondistended. No masses. Positive bowel sounds. No hepatosplenomegaly.  MUSCULOSKELETAL: No swelling, clubbing, or edema. Range of motion full in all extremities.  NEUROLOGIC: Cranial nerves II through XII are intact. No gross focal neurological deficits. Sensation intact. Reflexes intact.  SKIN: No ulceration, lesions, rashes, or cyanosis. Skin warm and dry. Turgor intact.  PSYCHIATRIC: Mood, affect within normal limits. The patient is awake, alert and oriented x 3. Insight, judgment intact.    LABORATORY PANEL:   CBC  Recent Labs Lab 05/25/15 0647  WBC 11.7*  HGB 14.6  HCT 43.6  PLT 188   ------------------------------------------------------------------------------------------------------------------  Chemistries  No results for input(s): NA, K, CL, CO2, GLUCOSE, BUN, CREATININE, CALCIUM, MG, AST, ALT, ALKPHOS, BILITOT in the last 168 hours.  Invalid input(s):  GFRCGP ------------------------------------------------------------------------------------------------------------------  Cardiac Enzymes No results for input(s): TROPONINI in the last 168 hours. ------------------------------------------------------------------------------------------------------------------  RADIOLOGY:  Dg Chest Portable 1 View  05/25/2015  CLINICAL DATA:  COPD.  Awoke from sleep profoundly short of breath. EXAM: PORTABLE CHEST - 1 VIEW COMPARISON:  One-view chest x-ray 08/27/2014. FINDINGS: The heart size is normal. Emphysematous changes are noted. No focal airspace disease is present. There is no edema or effusion. The visualized soft tissues and bony thorax are unremarkable. IMPRESSION: 1. Emphysema. 2. No acute superimposed disease. Electronically Signed   By: San Morelle M.D.   On: 05/25/2015 07:49    EKG:   Orders placed or performed during the hospital encounter of 05/25/15  . ED EKG  . ED EKG    IMPRESSION AND PLAN:   65 year old gentleman history of COPD unspecified presenting with shortness of breath  1. Acute respiratory failure with hypoxia/hypercapnia-Chronic obstructive pulmonary disease exacerbation: Provide DuoNeb treatments q. 4 hours, Solu-Medrol 60 mg IV q. daily, add azithromycin. Continue with home medications. Will add Advair to chronic medications, wean BiPAP as tolerated 2. Hypertension essential: Not on home medications will add as needed hydralazine may need to add chronic controlling agents 3. Venous thromboembolism prophylactic: Lovenox CMP pending at this time      All the records are reviewed and case discussed with ED provider. Management plans discussed with the patient, family and they are in agreement.  CODE STATUS: Full  TOTAL TIME TAKING CARE OF THIS PATIENT: 45 critical care minutes.  Hower,  Karenann Cai.D on 05/25/2015 at 8:23 AM  Between 7am to 6pm - Pager - [phone]  After 6pm: House Pager: -  [facility]  Office  [phone]  CC: Primary care physician; No PCP Per Patient

## 2015-05-25 NOTE — Plan of Care (Signed)
Problem: ICU Phase Progression Outcomes Goal: O2 sats trending toward baseline Outcome: Progressing Pt admitted on bipap. Now on [place].  Goal: Voiding-avoid urinary catheter unless indicated Outcome: Completed/Met Date Met:  05/25/15 Voids to urinal without difficuoty

## 2015-05-25 NOTE — Plan of Care (Signed)
Problem: Education: Goal: Knowledge of [facility] Education information/materials will improve Outcome: Progressing Verbalizes understanding of bipap, steroids and antibiotic purpose.  Problem: Safety: Goal: Ability to remain free from injury will improve Outcome: Progressing No injury  Problem: Skin Integrity: Goal: Risk for impaired skin integrity will decrease Outcome: Progressing Denies pain  Problem: ICU Phase Progression Outcomes Goal: O2 sats trending toward baseline Outcome: Progressing Has had bipap off at long intervals.  Continues with tight wheezes and decreased lung sounds throughout. Goal: Dyspnea controlled at rest Outcome: Progressing Dyspnea/ tachypnea relieved with bipap at intervals Goal: Initial discharge plan identified Outcome: Progressing Plans to be discharged to his home

## 2015-05-25 NOTE — ED Notes (Signed)
Pt reports waking from sleep profoundly short of breath. Pt has used albuterol MDI X 4 between 0100 and 0600. Pt has audible wheezing, both inspiratory and expiratory. Pt remains profoundly short of breath, tripoding, tachypneic and tachycardiac.

## 2015-05-25 NOTE — Progress Notes (Signed)
Clinical Education officer, museum (CSW) received consult for medication assistance. CSW asked RN to put in RN Case Management consult. RN agreed. Please reconsult if future social work needs arise. CSW signing off.   Blima Rich, LCSW [phone]

## 2015-05-25 NOTE — ED Notes (Signed)
Pt a/o. Sinus tach 120 on monitor. HTN 155/108. 95% bipap. Denies pain. Pt is unsure if he is breathing better.

## 2015-05-26 MED ORDER — PREDNISONE 20 MG PO TABS
40.0000 mg | ORAL_TABLET | Freq: Every day | ORAL | Status: DC
  Administered 2015-05-27: 40 mg via ORAL
  Filled 2015-05-26: qty 2

## 2015-05-26 NOTE — Progress Notes (Signed)
[facility] NAME: Gene Kim    MR#:  [id number]  DATE OF BIRTH:  [birth year]  SUBJECTIVE:  Did well overnight and remained on nasal cannula transfer out of intensive care unit this morning   no complaints, however, breathing still not at baseline  REVIEW OF SYSTEMS:  CONSTITUTIONAL: No fever, fatigue or weakness.  EYES: No blurred or double vision.  EARS, NOSE, AND THROAT: No tinnitus or ear pain.  RESPIRATORY: Positive cough, positive shortness of breath, denies wheezing or hemoptysis.  CARDIOVASCULAR: No chest pain, orthopnea, edema.  GASTROINTESTINAL: No nausea, vomiting, diarrhea or abdominal pain.  GENITOURINARY: No dysuria, hematuria.  ENDOCRINE: No polyuria, nocturia,  HEMATOLOGY: No anemia, easy bruising or bleeding SKIN: No rash or lesion. MUSCULOSKELETAL: No joint pain or arthritis.   NEUROLOGIC: No tingling, numbness, weakness.  PSYCHIATRY: No anxiety or depression.   DRUG ALLERGIES:  No Known Allergies  VITALS:  Blood pressure 119/74, pulse 102, temperature 98.7 F (37.1 C), temperature source Oral, resp. rate 15, height 5\' 9"  (1.753 m), weight 60.2 kg (132 lb 11.5 oz), SpO2 94 %.  PHYSICAL EXAMINATION:  VITAL SIGNS: Filed Vitals:   05/26/15 1000 05/26/15 1104  BP: 113/71 119/74  Pulse: 101 102  Temp:  98.7 F (37.1 C)  Resp: 15    GENERAL:65 y.o.male currently in no acute distress.  HEAD: Normocephalic, atraumatic.  EYES: Pupils equal, round, reactive to light. Extraocular muscles intact. No scleral icterus.  MOUTH: Moist mucosal membrane. Dentition intact. No abscess noted.  EAR, NOSE, THROAT: Clear without exudates. No external lesions.  NECK: Supple. No thyromegaly. No nodules. No JVD.  PULMONARY: Overall improved respiratory sounds-Scant wheeze without rails or rhonci. No use of accessory muscles, Good respiratory effort. good air entry bilaterally CHEST: Nontender to palpation.   CARDIOVASCULAR: S1 and S2. Regular rate and rhythm. No murmurs, rubs, or gallops. No edema. Pedal pulses 2+ bilaterally.  GASTROINTESTINAL: Soft, nontender, nondistended. No masses. Positive bowel sounds. No hepatosplenomegaly.  MUSCULOSKELETAL: No swelling, clubbing, or edema. Range of motion full in all extremities.  NEUROLOGIC: Cranial nerves II through XII are intact. No gross focal neurological deficits. Sensation intact. Reflexes intact.  SKIN: No ulceration, lesions, rashes, or cyanosis. Skin warm and dry. Turgor intact.  PSYCHIATRIC: Mood, affect within normal limits. The patient is awake, alert and oriented x 3. Insight, judgment intact.      LABORATORY PANEL:   CBC  Recent Labs Lab 05/25/15 0647  WBC 11.7*  HGB 14.6  HCT 43.6  PLT 188   ------------------------------------------------------------------------------------------------------------------  Chemistries   Recent Labs Lab 05/25/15 0647 05/25/15 0800  NA 141  --   K 4.2  --   CL 107  --   CO2 22  --   GLUCOSE 125*  --   BUN 20  --   CREATININE 0.63 0.68  CALCIUM 8.8*  --   AST 24  --   ALT 17  --   ALKPHOS 87  --   BILITOT 0.7  --    ------------------------------------------------------------------------------------------------------------------  Cardiac Enzymes  Recent Labs Lab 05/25/15 0647  TROPONINI <0.03   ------------------------------------------------------------------------------------------------------------------  RADIOLOGY:  Dg Chest Portable 1 View  05/25/2015  CLINICAL DATA:  COPD.  Awoke from sleep profoundly short of breath. EXAM: PORTABLE CHEST - 1 VIEW COMPARISON:  One-view chest x-ray 08/27/2014. FINDINGS: The heart size is normal. Emphysematous changes are noted. No focal airspace disease is present. There is no edema or effusion. The visualized soft  tissues and bony thorax are unremarkable. IMPRESSION: 1. Emphysema. 2. No acute superimposed disease. Electronically Signed    By: San Morelle M.D.   On: 05/25/2015 07:49    EKG:   Orders placed or performed during the hospital encounter of 05/25/15  . ED EKG  . ED EKG    ASSESSMENT AND PLAN:   65 year old Caucasian gentleman admitted 05/25/2015 and shortness of breath  1. Acute on chronic respiratory failure with hypoxia and hypercapnia-resolving COPD exacerbation: Continue dulera, oxygen, breathing treatments, azithromycin will decrease steroids 2. Essential hypertension: Blood pressure stable 3. Venous embolism prophylactic: Lovenox  All the records are reviewed and case discussed with Care Management/Social Workerr. Management plans discussed with the patient, family and they are in agreement.  CODE STATUS: Full  TOTAL TIME TAKING CARE OF THIS PATIENT: 28 minutes.   POSSIBLE D/C IN 1-2 DAYS, DEPENDING ON CLINICAL CONDITION.   Hower,  Karenann Cai.D on 05/26/2015 at 12:07 PM  Between 7am to 6pm - Pager - [phone]  After 6pm: House Pager: - [phone]  [facility]  Office  [phone]  CC: Primary care physician; No PCP Per Patient

## 2015-05-26 NOTE — Progress Notes (Signed)
Pt is in no distress. Bipap is not in pt's room and is not needed.

## 2015-05-26 NOTE — Progress Notes (Signed)
Dr.Hower here to see patient, transfer from ICU. On oxygen at 3L per .  IV site - right forearm. Alert and Oriented x4.

## 2015-05-27 ENCOUNTER — Telehealth: Payer: Self-pay | Admitting: Pulmonary Disease

## 2015-05-27 MED ORDER — FLUTICASONE-SALMETEROL 100-50 MCG/DOSE IN AEPB: 1.0000 | INHALATION_SPRAY | Freq: Two times a day (BID) | RESPIRATORY_TRACT | Status: DC

## 2015-05-27 MED ORDER — AZITHROMYCIN 250 MG PO TABS
500.0000 mg | ORAL_TABLET | Freq: Every day | ORAL | Status: DC
  Administered 2015-05-27: 500 mg via ORAL
  Filled 2015-05-27: qty 2

## 2015-05-27 MED ORDER — OXYCODONE HCL 5 MG PO TABS: 5.0000 mg | ORAL_TABLET | ORAL | Status: DC | PRN

## 2015-05-27 MED ORDER — AZITHROMYCIN 250 MG PO TABS: 250.0000 mg | ORAL_TABLET | Freq: Every day | ORAL | Status: DC

## 2015-05-27 MED ORDER — PREDNISONE 10 MG (21) PO TBPK: 10.0000 mg | ORAL_TABLET | Freq: Every day | ORAL | Status: DC

## 2015-05-27 NOTE — Discharge Summary (Signed)
[facility] NAME: Gene Kim    MR#:  [id number]  DATE OF BIRTH:  Jul 16, 1950  DATE OF ADMISSION:  05/25/2015 ADMITTING PHYSICIAN: Lytle Butte, MD  DATE OF DISCHARGE: No discharge date for patient encounter.  PRIMARY CARE PHYSICIAN: No PCP Per Patient    ADMISSION DIAGNOSIS:  Respiratory distress  DISCHARGE DIAGNOSIS:  Principal Problem:   Acute respiratory failure with hypoxia (HCC)  COPD exacerbation  SECONDARY DIAGNOSIS:   Past Medical History  Diagnosis Date  . COPD (chronic obstructive pulmonary disease) [facility])     HOSPITAL COURSE:  Gene Kim  is a 65 y.o. male admitted 05/25/2015 with chief complaint shortness of breath. Please see H&P performed by me for further information. Upon presentation to the hospital patient had been respiratory distress requiring BiPAP therapy as well as stay in the intensive care unit. Fortunately after antibiotics, steroids, breathing treatments and his breathing status has improved significantly. He is now stable for discharge.  DISCHARGE CONDITIONS:   Stable/improved  CONSULTS OBTAINED:     DRUG ALLERGIES:  No Known Allergies  DISCHARGE MEDICATIONS:   Current Discharge Medication List    START taking these medications   Details  azithromycin (ZITHROMAX) 250 MG tablet Take 1 tablet (250 mg total) by mouth daily. Qty: 4 each, Refills: 0    Fluticasone-Salmeterol (ADVAIR DISKUS) 100-50 MCG/DOSE AEPB Inhale 1 puff into the lungs 2 (two) times daily. Qty: 60 each, Refills: 0    oxyCODONE (OXY IR/ROXICODONE) 5 MG immediate release tablet Take 1 tablet (5 mg total) by mouth every 4 (four) hours as needed for moderate pain. Qty: 30 tablet, Refills: 0    predniSONE (STERAPRED UNI-PAK 21 TAB) 10 MG (21) TBPK tablet Take 1 tablet (10 mg total) by mouth daily. 40mg  oral 1 day, then 20mg  oral for 2 days, then 10mg  oral 2 days, then stop Qty: 10 tablet, Refills:  0      CONTINUE these medications which have NOT CHANGED   Details  albuterol (PROVENTIL HFA;VENTOLIN HFA) 108 (90 Base) MCG/ACT inhaler Inhale 2 puffs into the lungs every 4 (four) hours as needed for wheezing or shortness of breath.    ipratropium-albuterol (DUONEB) 0.5-2.5 (3) MG/3ML SOLN Inhale 3 mLs into the lungs every 6 (six) hours as needed. Refills: 2    Ipratropium-Albuterol (COMBIVENT RESPIMAT) 20-100 MCG/ACT AERS respimat Inhale 1 puff into the lungs every 6 (six) hours. Qty: 1 Inhaler, Refills: 0      STOP taking these medications     doxycycline (VIBRA-TABS) 100 MG tablet      mupirocin ointment (BACTROBAN) 2 %      predniSONE (DELTASONE) 10 MG tablet          DISCHARGE INSTRUCTIONS:    DIET:  Regular diet  DISCHARGE CONDITION:  Good  ACTIVITY:  Activity as tolerated  OXYGEN:  Home Oxygen: No.   Oxygen Delivery: room air  DISCHARGE LOCATION:  home   If you experience worsening of your admission symptoms, develop shortness of breath, life threatening emergency, suicidal or homicidal thoughts you must seek medical attention immediately by calling 911 or calling your MD immediately  if symptoms less severe.  You Must read complete instructions/literature along with all the possible adverse reactions/side effects for all the Medicines you take and that have been prescribed to you. Take any new Medicines after you have completely understood and accpet all the possible adverse reactions/side effects.   Please note  You were  cared for by a hospitalist during your hospital stay. If you have any questions about your discharge medications or the care you received while you were in the hospital after you are discharged, you can call the unit and asked to speak with the hospitalist on call if the hospitalist that took care of you is not available. Once you are discharged, your primary care physician will handle any further medical issues. Please note that NO  REFILLS for any discharge medications will be authorized once you are discharged, as it is imperative that you return to your primary care physician (or establish a relationship with a primary care physician if you do not have one) for your aftercare needs so that they can reassess your need for medications and monitor your lab values.    On the day of Discharge:   VITAL SIGNS:  Blood pressure 119/73, pulse 81, temperature 97.7 F (36.5 C), temperature source Oral, resp. rate 20, height 5\' 9"  (1.753 m), weight 60.2 kg (132 lb 11.5 oz), SpO2 90 %.  I/O:   Intake/Output Summary (Last 24 hours) at 05/27/15 0952 Last data filed at 05/27/15 0905  Gross per 24 hour  Intake    840 ml  Output   1600 ml  Net   -760 ml    PHYSICAL EXAMINATION:  GENERAL:  65 y.o.-year-old patient lying in the bed with no acute distress.  EYES: Pupils equal, round, reactive to light and accommodation. No scleral icterus. Extraocular muscles intact.  HEENT: Head atraumatic, normocephalic. Oropharynx and nasopharynx clear.  NECK:  Supple, no jugular venous distention. No thyroid enlargement, no tenderness.  LUNGS: Normal breath sounds bilaterally, no wheezing, rales,rhonchi or crepitation. No use of accessory muscles of respiration.  CARDIOVASCULAR: S1, S2 normal. No murmurs, rubs, or gallops.  ABDOMEN: Soft, non-tender, non-distended. Bowel sounds present. No organomegaly or mass.  EXTREMITIES: No pedal edema, cyanosis, or clubbing.  NEUROLOGIC: Cranial nerves II through XII are intact. Muscle strength 5/5 in all extremities. Sensation intact. Gait not checked.  PSYCHIATRIC: The patient is alert and oriented x 3.  SKIN: No obvious rash, lesion, or ulcer.   DATA REVIEW:   CBC  Recent Labs Lab 05/25/15 0647  WBC 11.7*  HGB 14.6  HCT 43.6  PLT 188    Chemistries   Recent Labs Lab 05/25/15 0647 05/25/15 0800  NA 141  --   K 4.2  --   CL 107  --   CO2 22  --   GLUCOSE 125*  --   BUN 20  --    CREATININE 0.63 0.68  CALCIUM 8.8*  --   AST 24  --   ALT 17  --   ALKPHOS 87  --   BILITOT 0.7  --     Cardiac Enzymes  Recent Labs Lab 05/25/15 0647  TROPONINI <0.03    Microbiology Results  Results for orders placed or performed during the hospital encounter of 05/25/15  MRSA PCR Screening     Status: None   Collection Time: 05/25/15  8:13 AM  Result Value Ref Range Status   MRSA by PCR NEGATIVE NEGATIVE Final    Comment:        The GeneXpert MRSA Assay (FDA approved for NASAL specimens only), is one component of a comprehensive MRSA colonization surveillance program. It is not intended to diagnose MRSA infection nor to guide or monitor treatment for MRSA infections.     RADIOLOGY:  No results found.   Management plans discussed with the  patient, family and they are in agreement.  CODE STATUS:     Code Status Orders        Start     Ordered   05/25/15 0800  Full code   Continuous     05/25/15 0800    Code Status History    Date Active Date Inactive Code Status Order ID Comments User Context   08/27/2014  9:04 AM 08/28/2014  4:25 PM Full Code [id number]  Juluis Mire, MD Inpatient      TOTAL TIME TAKING CARE OF THIS PATIENT: 28 minutes.    Hower,  Karenann Cai.D on 05/27/2015 at 9:52 AM  Between 7am to 6pm - Pager - [phone]  After 6pm go to [email or web address] - password [email or web address] [facility]  [place]  Office  [phone]  CC: Primary care physician; No PCP Per Patient

## 2015-05-27 NOTE — Care Management Note (Signed)
Case Management Note  Patient Details  Name: Gene Kim MRN: [id number] Date of Birth: 08/31/1950  Subjective/Objective:    Oxygen trials completed and [name] did not qualify for home oxygen at this time. No other home health orders at this time..                Action/Plan:   Expected Discharge Date:                  Expected Discharge Plan:     In-House Referral:     Discharge planning Services     Post Acute Care Choice:    Choice offered to:     DME Arranged:    DME Agency:     HH Arranged:    [place] Agency:     Status of Service:     Medicare Important Message Given:    Date Medicare IM Given:    Medicare IM give by:    Date Additional Medicare IM Given:    Additional Medicare Important Message give by:     If discussed at [place] of Stay Meetings, dates discussed:    Additional Comments:  [name] A, RN 05/27/2015, 11:17 AM

## 2015-05-27 NOTE — Progress Notes (Signed)
PHARMACIST - PHYSICIAN COMMUNICATION  CONCERNING: Antibiotic IV to Oral Route Change Policy  RECOMMENDATION: This patient is receiving Azithromycin by the intravenous route.  Based on criteria approved by the Pharmacy and Therapeutics Committee, the antibiotic(s) is/are being converted to the equivalent oral dose form(s).   DESCRIPTION: These criteria include:  Patient being treated for a respiratory tract infection, urinary tract infection, cellulitis or clostridium difficile associated diarrhea if on metronidazole  The patient is not neutropenic and does not exhibit a GI malabsorption state  The patient is eating (either orally or via tube) and/or has been taking other orally administered medications for a least 24 hours  The patient is improving clinically and has a Tmax < 100.5  If you have questions about this conversion, please contact the Pharmacy Department  []   [phone] )  [facility] [x]   [phone] )  [facility] []   [phone] )  [facility] []   [phone] )  [facility] []   [phone] )  [place], PharmD Clinical Pharmacist 05/27/2015

## 2015-05-27 NOTE — Progress Notes (Signed)
Patient taken to visitors entrance via wheelchair by volunteer to be taken home in personal vehicle.

## 2015-05-27 NOTE — Progress Notes (Signed)
Discharge instructions and prescriptions given with verbalized understanding.

## 2015-05-27 NOTE — Telephone Encounter (Signed)
I don't see anything on the schedule.  Patient is HOH and wants sister to schedule fu.  Please let me know when we can fit her in and i will call her back.

## 2015-05-27 NOTE — Telephone Encounter (Signed)
Pt needs hospital f/u ASAP per [facility].

## 2015-05-27 NOTE — Telephone Encounter (Signed)
Per DS, pt needs 2-3 week f/u for COPD exacerbation.

## 2015-05-28 DIAGNOSIS — K51211 Ulcerative (chronic) proctitis with rectal bleeding: Secondary | ICD-10-CM | POA: Insufficient documentation

## 2015-05-28 NOTE — Telephone Encounter (Signed)
Call and give an appt date of 06-14-15 @ 830am, pt needs to arrive at 815am to check in with Ramachandran. i have put in appt. Thanks.

## 2015-06-12 DIAGNOSIS — J432 Centrilobular emphysema: Secondary | ICD-10-CM | POA: Insufficient documentation

## 2015-06-14 ENCOUNTER — Inpatient Hospital Stay: Payer: BLUE CROSS/BLUE SHIELD | Admitting: Internal Medicine

## 2015-07-05 ENCOUNTER — Ambulatory Visit: Payer: BLUE CROSS/BLUE SHIELD | Admitting: Internal Medicine

## 2015-08-06 IMAGING — CR DG CHEST 2V
1 series · 2 of 2 positions shown · non-contrast
Comparison: None.

CLINICAL DATA: Shortness of breath.

EXAM:
CHEST  2 VIEW

[Series 1: dxr chest pa (or ap) and lateral · 0.14mm/px · 2 of 2 slices shown]
[im 1/2]
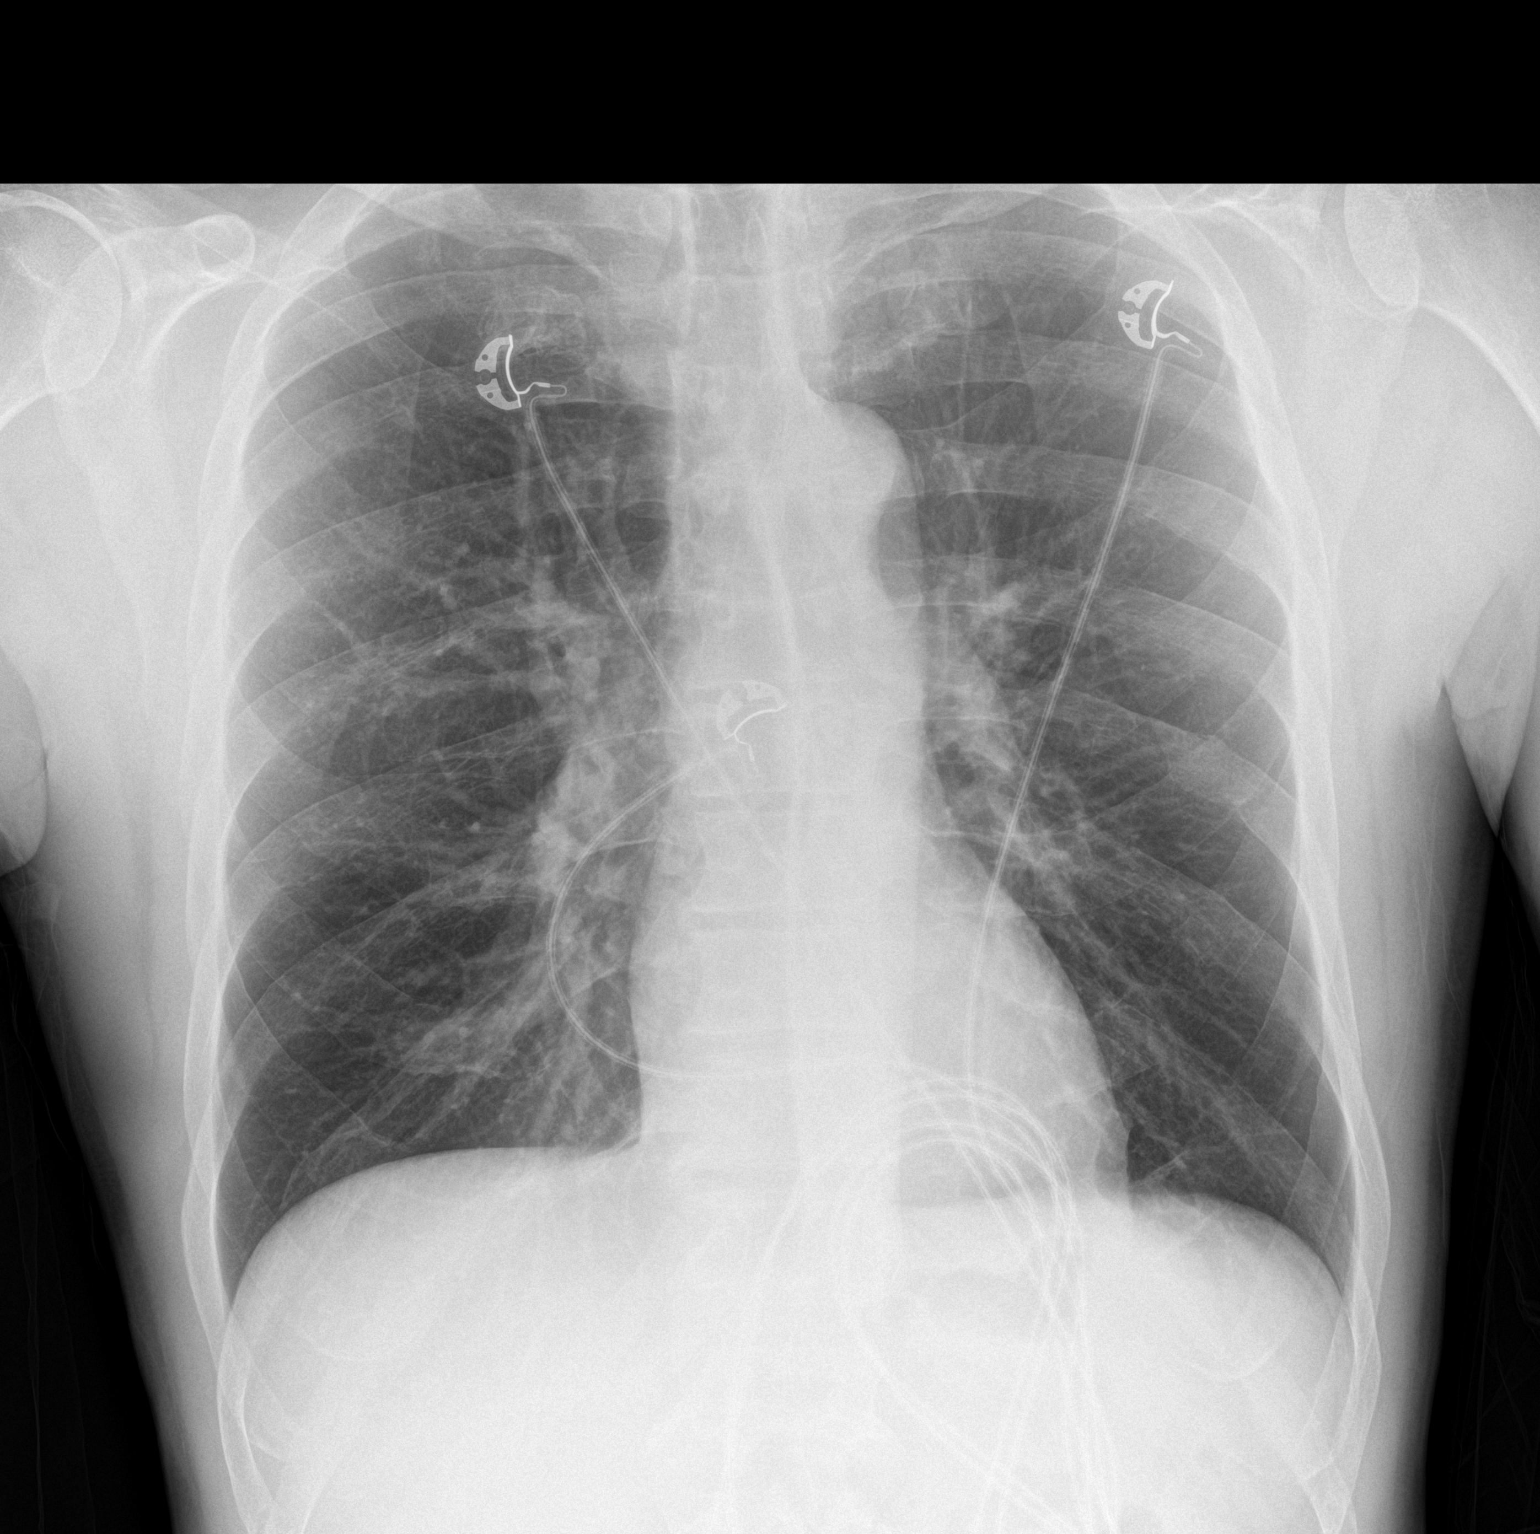
[im 2/2]
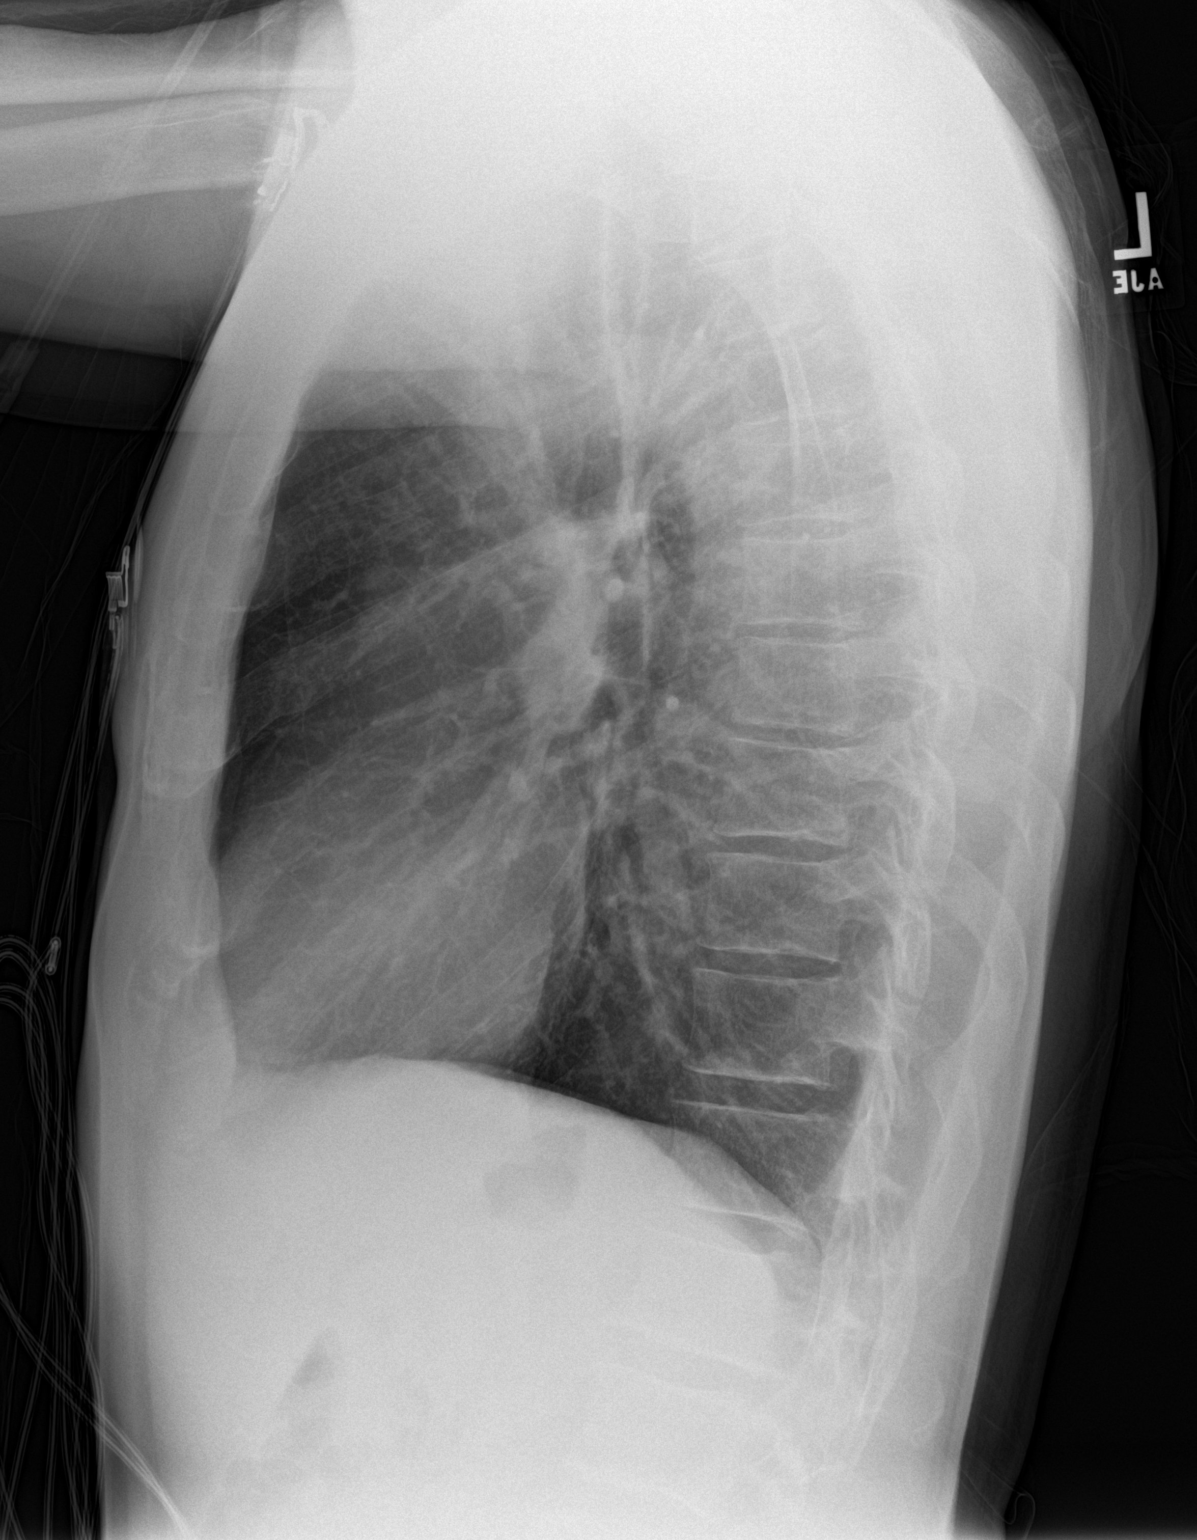

[2 of 2 positions shown; findings below may reference images not displayed]

FINDINGS: The heart size and mediastinal contours are within normal limits.
Both lungs are clear. The visualized skeletal structures are
unremarkable.
IMPRESSION: No active cardiopulmonary disease.

## 2016-10-16 IMAGING — DX DG CHEST 1V PORT
1 series · 1 of 1 positions shown · non-contrast
Comparison: One-view chest x-ray 08/27/2014.

CLINICAL DATA: COPD.  Awoke from sleep profoundly short of breath.

EXAM:
PORTABLE CHEST - 1 VIEW

[chest ap]
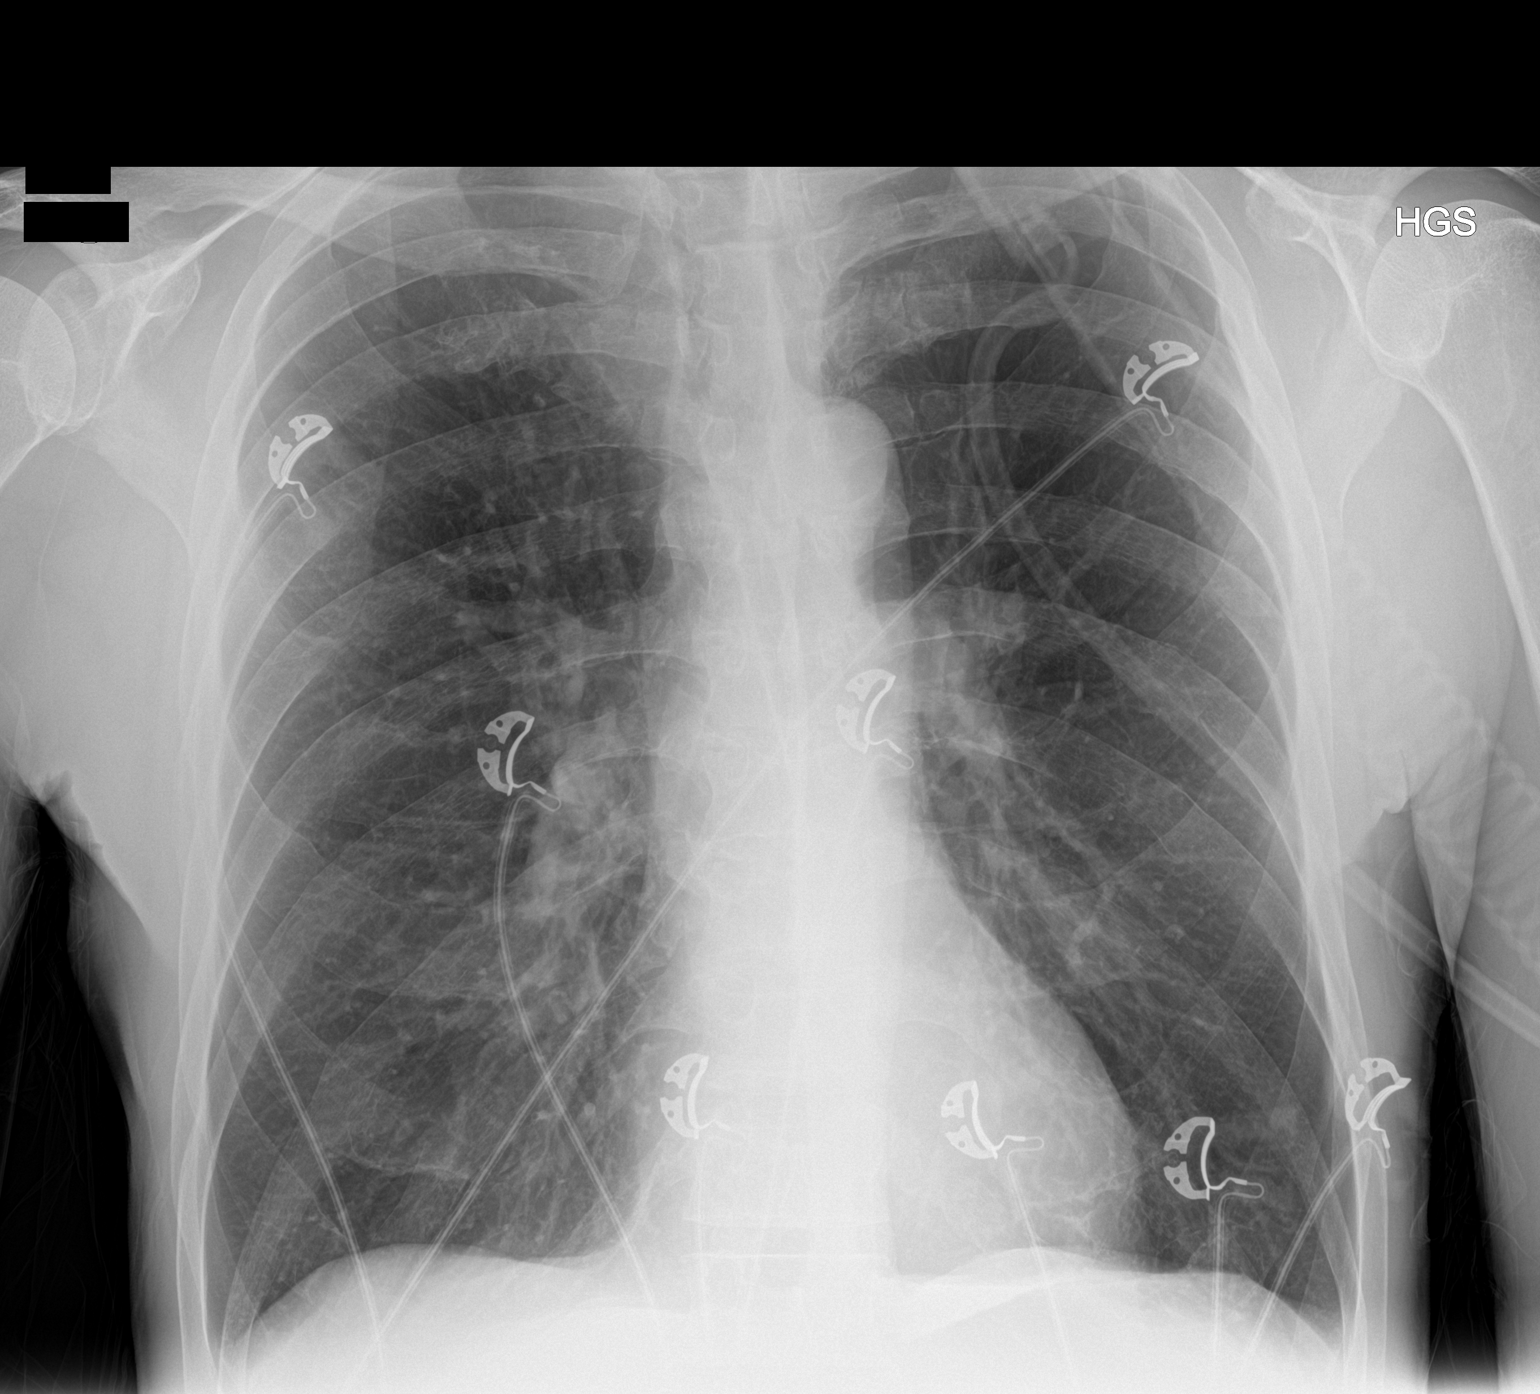

[1 of 1 positions shown; findings below may reference images not displayed]

FINDINGS: The heart size is normal. Emphysematous changes are noted. No focal
airspace disease is present. There is no edema or effusion. The
visualized soft tissues and bony thorax are unremarkable.
IMPRESSION: 1. Emphysema.
2. No acute superimposed disease.

## 2018-11-03 ENCOUNTER — Other Ambulatory Visit: Payer: Self-pay

## 2018-11-03 DIAGNOSIS — Z20822 Contact with and (suspected) exposure to covid-19: Secondary | ICD-10-CM

## 2018-11-04 LAB — NOVEL CORONAVIRUS, NAA: SARS-CoV-2, NAA: NOT DETECTED

## 2018-11-08 ENCOUNTER — Telehealth: Payer: Self-pay | Admitting: General Practice

## 2018-11-08 NOTE — Telephone Encounter (Signed)
Negative COVID results given. Patient results "NOT Detected." Caller expressed understanding. ° °

## 2020-11-15 ENCOUNTER — Encounter: Payer: Self-pay | Admitting: Otolaryngology

## 2020-11-15 ENCOUNTER — Other Ambulatory Visit: Payer: Self-pay

## 2020-11-15 NOTE — Anesthesia Preprocedure Evaluation (Addendum)
Anesthesia Evaluation  Patient identified by MRN, date of birth, ID band Patient awake    Reviewed: Allergy & Precautions, H&P , NPO status , Patient's Chart, lab work & pertinent test results  Airway Mallampati: II  TM Distance: >3 FB Neck ROM: full    Dental no notable dental hx. (+) Missing, Partial Upper   Pulmonary COPD,  COPD inhaler, former smoker,  COPD appears stable on regimen of inhaled medications.   Pulmonary exam normal breath sounds clear to auscultation       Cardiovascular Normal cardiovascular exam Rhythm:regular Rate:Normal     Neuro/Psych    GI/Hepatic   Endo/Other    Renal/GU      Musculoskeletal   Abdominal   Peds  Hematology   Anesthesia Other Findings   Reproductive/Obstetrics                            Anesthesia Physical Anesthesia Plan  ASA: 2  Anesthesia Plan: General ETT   Post-op Pain Management:    Induction:   PONV Risk Score and Plan: 2 and Treatment may vary due to age or medical condition, Ondansetron and Dexamethasone  Airway Management Planned:   Additional Equipment:   Intra-op Plan:   Post-operative Plan:   Informed Consent: I have reviewed the patients History and Physical, chart, labs and discussed the procedure including the risks, benefits and alternatives for the proposed anesthesia with the patient or authorized representative who has indicated his/her understanding and acceptance.     Dental Advisory Given  Plan Discussed with: CRNA  Anesthesia Plan Comments:         Anesthesia Quick Evaluation

## 2020-11-20 NOTE — Discharge Instructions (Addendum)
[facility] ENDOSCOPIC SINUS SURGERY [facility]  What is Functional Endoscopic Sinus Surgery?  The Surgery involves making the natural openings of the sinuses larger by removing the bony partitions that separate the sinuses from the nasal cavity.  The natural sinus lining is preserved as much as possible to allow the sinuses to resume normal function after the surgery.  In some patients nasal polyps (excessively swollen lining of the sinuses) may be removed to relieve obstruction of the sinus openings.  The surgery is performed through the nose using lighted scopes, which eliminates the need for incisions on the face.  A septoplasty is a different procedure which is sometimes performed with sinus surgery.  It involves straightening the boy partition that separates the two sides of your nose.  A crooked or deviated septum may need repair if is obstructing the sinuses or nasal airflow.  Turbinate reduction is also often performed during sinus surgery.  The turbinates are bony proturberances from the side walls of the nose which swell and can obstruct the nose in patients with sinus and allergy problems.  Their size can be surgically reduced to help relieve nasal obstruction.  What Can Sinus Surgery Do For Me?  Sinus surgery can reduce the frequency of sinus infections requiring antibiotic treatment.  This can provide improvement in nasal congestion, post-nasal drainage, facial pressure and nasal obstruction.  Surgery will NOT prevent you from ever having an infection again, so it usually only for patients who get infections 4 or more times yearly requiring antibiotics, or for infections that do not clear with antibiotics.  It will not cure nasal allergies, so patients with allergies may still require medication to treat their allergies after surgery. Surgery may improve headaches related to sinusitis, however, some people will continue to  require medication to control sinus headaches related to allergies.  Surgery will do nothing for other forms of headache (migraine, tension or cluster).  What Are the Risks of Endoscopic Sinus Surgery?  Current techniques allow surgery to be performed safely with little risk, however, there are rare complications that patients should be aware of.  Because the sinuses are located around the eyes, there is risk of eye injury, including blindness, though again, this would be quite rare. This is usually a result of bleeding behind the eye during surgery, which can effect vision, though there are treatments to protect the vision and prevent permanent injury. More serious complications would include bleeding inside the brain cavity or damage to the brain.This happens when the fluid around the brain leaks out into the sinus cavity.  Again, all of these complications are uncommon, and spinal fluid leaks can be safely managed surgically if they occur.  The most common complication of sinus surgery is bleeding from the nose, which may require packing or cauterization of the nose.  Patients with polyps may experience recurrence of the polyps that would require revision surgery.  Alterations of sense of smell or injury to the tear ducts are also rare complications.   What is the Surgery Like, and what is the Recovery?  The Surgery usually takes a couple of hours to perform, and is usually performed under a general anesthetic (completely asleep).  Patients are usually discharged home after a couple of hours.  Sometimes during surgery it is necessary to pack the nose to control bleeding, and the packing is left in place for 24 - 48 hours, and removed by your surgeon.  If   a septoplasty was performed during the procedure, there is often a splint placed which must be removed after 5-7 days.   Discomfort: Pain is usually mild to moderate, and can be controlled by prescription pain medication or acetaminophen (Tylenol).   Aspirin, Ibuprofen (Advil, Motrin), or Naprosyn (Aleve) should be avoided, as they can cause increased bleeding.  Most patients feel sinus pressure like they have a bad head cold for several days.  Sleeping with your head elevated can help reduce swelling and facial pressure, as can ice packs over the face.  A humidifier may be helpful to keep the mucous and blood from drying in the nose.   Diet: There are no specific diet restrictions, however, you should generally start with clear liquids and a light diet of bland foods because the anesthetic can cause some nausea.  Advance your diet depending on how your stomach feels.  Taking your pain medication with food will often help reduce stomach upset which pain medications can cause.  Nasal Saline Irrigation: It is important to remove blood clots and dried mucous from the nose as it is healing.  This is done by having you irrigate the nose at least 3 - 4 times daily with a salt water solution.  We recommend using NeilMed Sinus Rinse (available at the drug store).  Fill the squeeze bottle with the solution, bend over a sink, and insert the tip of the squeeze bottle into the nose  of an inch.  Point the tip of the squeeze bottle towards the inside corner of the eye on the same side your irrigating.  Squeeze the bottle and gently irrigate the nose.  If you bend forward as you do this, most of the fluid will flow back out of the nose, instead of down your throat.   The solution should be warm, near body temperature, when you irrigate.   Each time you irrigate, you should use a full squeeze bottle.   Note that if you are instructed to use Nasal Steroid Sprays at any time after your surgery, irrigate with saline BEFORE using the steroid spray, so you do not wash it all out of the nose. Another product, Nasal Saline Gel (such as AYR Nasal Saline Gel) can be applied in each nostril 3 - 4 times daily to moisture the nose and reduce scabbing or crusting.  Bleeding:   Bloody drainage from the nose can be expected for several days, and patients are instructed to irrigate their nose frequently with salt water to help remove mucous and blood clots.  The drainage may be dark red or brown, though some fresh blood may be seen intermittently, especially after irrigation.  Do not blow you nose, as bleeding may occur. If you must sneeze, keep your mouth open to allow air to escape through your mouth.  If heavy bleeding occurs: Irrigate the nose with saline to rinse out clots, then spray the nose 3 - 4 times with Afrin Nasal Decongestant Spray.  The spray will constrict the blood vessels to slow bleeding.  Pinch the lower half of your nose shut to apply pressure, and lay down with your head elevated.  Ice packs over the nose may help as well. If bleeding persists despite these measures, you should notify your doctor.  Do not use the Afrin routinely to control nasal congestion after surgery, as it can result in worsening congestion and may affect healing.     Activity: Return to work varies among patients. Most patients will be out   of work at least 5 - 7 days to recover.  Patient may return to work after they are off of narcotic pain medication, and feeling well enough to perform the functions of their job.  Patients must avoid heavy lifting (over 10 pounds) or strenuous physical for 2 weeks after surgery, so your employer may need to assign you to light duty, or keep you out of work longer if light duty is not possible.  NOTE: you should not drive, operate dangerous machinery, do any mentally demanding tasks or make any important legal or financial decisions while on narcotic pain medication and recovering from the general anesthetic.    Call Your Doctor Immediately if You Have Any of the Following: Bleeding that you cannot control with the above measures Loss of vision, double vision, bulging of the eye or black eyes. Fever over 101 degrees Neck stiffness with severe headache,  fever, nausea and change in mental state. You are always encouraged to call anytime with concerns, however, please call with requests for pain medication refills during office hours.  Office Endoscopy: During follow-up visits your doctor will remove any packing or splints that may have been placed and evaluate and clean your sinuses endoscopically.  Topical anesthetic will be used to make this as comfortable as possible, though you may want to take your pain medication prior to the visit.  How often this will need to be done varies from patient to patient.  After complete recovery from the surgery, you may need follow-up endoscopy from time to time, particularly if there is concern of recurrent infection or nasal polyps.  

## 2020-11-28 ENCOUNTER — Other Ambulatory Visit: Payer: Self-pay

## 2020-11-28 ENCOUNTER — Encounter: Payer: Self-pay | Admitting: Otolaryngology

## 2020-11-28 ENCOUNTER — Encounter
Admission: RE | Disposition: A | Discharge status: Home / Self Care | Payer: Self-pay | Source: Home / Self Care | Attending: Otolaryngology

## 2020-11-28 ENCOUNTER — Ambulatory Visit
Admission: RE | Admit: 2020-11-28 | Discharge: 2020-11-28 | Disposition: A | Discharge status: Home / Self Care | Payer: Medicare Other | Attending: Otolaryngology | Admitting: Otolaryngology

## 2020-11-28 ENCOUNTER — Ambulatory Visit: Payer: Medicare Other | Admitting: Anesthesiology

## 2020-11-28 DIAGNOSIS — Z79899 Other long term (current) drug therapy: Secondary | ICD-10-CM | POA: Insufficient documentation

## 2020-11-28 DIAGNOSIS — Z87891 Personal history of nicotine dependence: Secondary | ICD-10-CM | POA: Insufficient documentation

## 2020-11-28 DIAGNOSIS — Z7951 Long term (current) use of inhaled steroids: Secondary | ICD-10-CM | POA: Insufficient documentation

## 2020-11-28 DIAGNOSIS — Z8379 Family history of other diseases of the digestive system: Secondary | ICD-10-CM | POA: Insufficient documentation

## 2020-11-28 DIAGNOSIS — J342 Deviated nasal septum: Secondary | ICD-10-CM | POA: Insufficient documentation

## 2020-11-28 DIAGNOSIS — J343 Hypertrophy of nasal turbinates: Secondary | ICD-10-CM | POA: Diagnosis not present

## 2020-11-28 DIAGNOSIS — Z809 Family history of malignant neoplasm, unspecified: Secondary | ICD-10-CM | POA: Diagnosis not present

## 2020-11-28 DIAGNOSIS — J320 Chronic maxillary sinusitis: Secondary | ICD-10-CM | POA: Diagnosis not present

## 2020-11-28 DIAGNOSIS — J321 Chronic frontal sinusitis: Secondary | ICD-10-CM | POA: Insufficient documentation

## 2020-11-28 DIAGNOSIS — Z8249 Family history of ischemic heart disease and other diseases of the circulatory system: Secondary | ICD-10-CM | POA: Insufficient documentation

## 2020-11-28 DIAGNOSIS — J322 Chronic ethmoidal sinusitis: Secondary | ICD-10-CM | POA: Diagnosis not present

## 2020-11-28 HISTORY — PX: IMAGE GUIDED SINUS SURGERY: SHX6570

## 2020-11-28 HISTORY — PX: ETHMOIDECTOMY: SHX5197

## 2020-11-28 HISTORY — PX: MAXILLARY ANTROSTOMY: SHX2003

## 2020-11-28 HISTORY — PX: SEPTOPLASTY: SHX2393

## 2020-11-28 SURGERY — SINUS SURGERY, WITH IMAGING GUIDANCE
Anesthesia: General | Site: Nose

## 2020-11-28 MED ORDER — PHENYLEPHRINE HCL 0.5 % NA SOLN
NASAL | Status: DC | PRN
  Administered 2020-11-28: 15 mL via TOPICAL

## 2020-11-28 MED ORDER — LACTATED RINGERS IV SOLN: INTRAVENOUS | Status: DC

## 2020-11-28 MED ORDER — LIDOCAINE HCL (CARDIAC) PF 100 MG/5ML IV SOSY
PREFILLED_SYRINGE | INTRAVENOUS | Status: DC | PRN
  Administered 2020-11-28: 50 mg via INTRAVENOUS

## 2020-11-28 MED ORDER — ACETAMINOPHEN 10 MG/ML IV SOLN
1000.0000 mg | Freq: Once | INTRAVENOUS | Status: AC
  Administered 2020-11-28: 1000 mg via INTRAVENOUS

## 2020-11-28 MED ORDER — FENTANYL CITRATE (PF) 100 MCG/2ML IJ SOLN
INTRAMUSCULAR | Status: DC | PRN
  Administered 2020-11-28: 50 ug via INTRAVENOUS

## 2020-11-28 MED ORDER — DEXTROSE 5 % IV SOLN
2000.0000 mg | Freq: Once | INTRAVENOUS | Status: AC
  Administered 2020-11-28: 2 g via INTRAVENOUS

## 2020-11-28 MED ORDER — SUCCINYLCHOLINE CHLORIDE 200 MG/10ML IV SOSY
PREFILLED_SYRINGE | INTRAVENOUS | Status: DC | PRN
Start: 2020-11-28 — End: 2020-11-28
  Administered 2020-11-28: 100 mg via INTRAVENOUS

## 2020-11-28 MED ORDER — EPHEDRINE SULFATE 50 MG/ML IJ SOLN
INTRAMUSCULAR | Status: DC | PRN
  Administered 2020-11-28 (×2): 5 mg via INTRAVENOUS

## 2020-11-28 MED ORDER — CEPHALEXIN 500 MG PO CAPS: 500.0000 mg | ORAL_CAPSULE | Freq: Two times a day (BID) | ORAL | 0 refills | Status: AC

## 2020-11-28 MED ORDER — HYDROCODONE-ACETAMINOPHEN 5-325 MG PO TABS: 1.0000 | ORAL_TABLET | ORAL | 0 refills | Status: AC | PRN

## 2020-11-28 MED ORDER — PROPOFOL 10 MG/ML IV BOLUS
INTRAVENOUS | Status: DC | PRN
  Administered 2020-11-28: 150 mg via INTRAVENOUS

## 2020-11-28 MED ORDER — ONDANSETRON HCL 4 MG/2ML IJ SOLN
INTRAMUSCULAR | Status: DC | PRN
  Administered 2020-11-28: 4 mg via INTRAVENOUS

## 2020-11-28 MED ORDER — PREDNISONE 10 MG PO TABS: ORAL_TABLET | ORAL | 0 refills | Status: AC

## 2020-11-28 MED ORDER — DEXAMETHASONE SODIUM PHOSPHATE 4 MG/ML IJ SOLN
INTRAMUSCULAR | Status: DC | PRN
Start: 2020-11-28 — End: 2020-11-28
  Administered 2020-11-28: 10 mg via INTRAVENOUS

## 2020-11-28 MED ORDER — LIDOCAINE-EPINEPHRINE 1 %-1:100000 IJ SOLN
INTRAMUSCULAR | Status: DC | PRN
  Administered 2020-11-28: 6.5 mL

## 2020-11-28 MED ORDER — OXYMETAZOLINE HCL 0.05 % NA SOLN
2.0000 | Freq: Once | NASAL | Status: AC
  Administered 2020-11-28: 2 via NASAL

## 2020-11-28 MED ORDER — GLYCOPYRROLATE 0.2 MG/ML IJ SOLN
INTRAMUSCULAR | Status: DC | PRN
Start: 2020-11-28 — End: 2020-11-28
  Administered 2020-11-28: .1 mg via INTRAVENOUS

## 2020-11-28 MED ORDER — MIDAZOLAM HCL 5 MG/5ML IJ SOLN
INTRAMUSCULAR | Status: DC | PRN
  Administered 2020-11-28: 2 mg via INTRAVENOUS

## 2020-11-28 SURGICAL SUPPLY — 36 items
BATTERY INSTRU NAVIGATION (MISCELLANEOUS) ×9 IMPLANT
BTRY SRG DRVR LF (MISCELLANEOUS) ×6
CANISTER SUCT 1200ML W/VALVE (MISCELLANEOUS) ×3 IMPLANT
CATH IV 18X1 1/4 SAFELET (CATHETERS) ×3 IMPLANT
COAGULATOR SUCT 8FR VV (MISCELLANEOUS) ×3 IMPLANT
ELECT REM PT RETURN 9FT ADLT (ELECTROSURGICAL) ×3
ELECTRODE REM PT RTRN 9FT ADLT (ELECTROSURGICAL) ×2 IMPLANT
GLOVE SURG GAMMEX PI TX LF 7.5 (GLOVE) ×6 IMPLANT
GOWN STRL REUS W/ TWL LRG LVL3 (GOWN DISPOSABLE) ×2 IMPLANT
GOWN STRL REUS W/TWL LRG LVL3 (GOWN DISPOSABLE) ×3
IV CATH 18X1 1/4 SAFELET (CATHETERS) ×2
IV NS 500ML (IV SOLUTION) ×3
IV NS 500ML BAXH (IV SOLUTION) ×2 IMPLANT
KIT TURNOVER KIT A (KITS) ×3 IMPLANT
NEEDLE ANESTHESIA  27G X 3.5 (NEEDLE) ×1
NEEDLE ANESTHESIA 27G X 3.5 (NEEDLE) ×2 IMPLANT
NEEDLE HYPO 27GX1-1/4 (NEEDLE) ×3 IMPLANT
NS IRRIG 500ML POUR BTL (IV SOLUTION) ×3 IMPLANT
PACK ENT CUSTOM (PACKS) ×3 IMPLANT
PACKING NASAL EPIS 4X2.4 XEROG (MISCELLANEOUS) ×6 IMPLANT
PATTIES SURGICAL .5 X3 (DISPOSABLE) ×3 IMPLANT
SHAVER DIEGO BLD STD TYPE A (BLADE) ×3 IMPLANT
SOL ANTI-FOG 6CC FOG-OUT (MISCELLANEOUS) ×2 IMPLANT
SOL FOG-OUT ANTI-FOG 6CC (MISCELLANEOUS) ×1
SPLINT NASAL SEPTAL BLV .50 ST (MISCELLANEOUS) ×3 IMPLANT
STRAP BODY AND KNEE 60X3 (MISCELLANEOUS) ×3 IMPLANT
SUT CHROMIC 3-0 (SUTURE) ×3
SUT CHROMIC 3-0 KS 27XMFL CR (SUTURE) ×2
SUT ETHILON 3-0 KS 30 BLK (SUTURE) ×3 IMPLANT
SUT PLAIN GUT 4-0 (SUTURE) ×6 IMPLANT
SUTURE CHRMC 3-0 KS 27XMFL CR (SUTURE) ×2 IMPLANT
SYR 3ML LL SCALE MARK (SYRINGE) ×3 IMPLANT
TOWEL OR 17X26 4PK STRL BLUE (TOWEL DISPOSABLE) ×3 IMPLANT
TRACKER CRANIALMASK (MASK) ×3 IMPLANT
TUBING DECLOG MULTIDEBRIDER (TUBING) ×3 IMPLANT
WATER STERILE IRR 250ML POUR (IV SOLUTION) ×3 IMPLANT

## 2020-11-28 NOTE — Transfer of Care (Signed)
Immediate Anesthesia Transfer of Care Note  Patient: Gene Kim  Procedure(s) Performed: IMAGE GUIDED SINUS SURGERY (Nose) SEPTOPLASTY (Nose) MAXILLARY ANTROSTOMY WITH TISSUE REMOVAL (Bilateral: Nose) ETHMOIDECTOMY WITH FRONTAL SINUSES (Bilateral: Nose)  Patient Location: PACU  Anesthesia Type: General ETT  Level of Consciousness: awake, alert  and patient cooperative  Airway and Oxygen Therapy: Patient Spontanous Breathing and Patient connected to supplemental oxygen  Post-op Assessment: Post-op Vital signs reviewed, Patient's Cardiovascular Status Stable, Respiratory Function Stable, Patent Airway and No signs of Nausea or vomiting  Post-op Vital Signs: Reviewed and stable  Complications: No notable events documented.

## 2020-11-28 NOTE — Anesthesia Procedure Notes (Signed)
Procedure Name: Intubation Date/Time: 11/28/2020 9:57 AM Performed by: Mayme Genta, CRNA Pre-anesthesia Checklist: Patient identified, Emergency Drugs available, Suction available, Patient being monitored and Timeout performed Patient Re-evaluated:Patient Re-evaluated prior to induction Oxygen Delivery Method: Circle system utilized Preoxygenation: Pre-oxygenation with 100% oxygen Induction Type: IV induction Ventilation: Mask ventilation without difficulty Laryngoscope Size: Miller and 3 Grade View: Grade II Tube type: Oral Rae Tube size: 7.5 mm Number of attempts: 1 Airway Equipment and Method: Bougie stylet Placement Confirmation: ETT inserted through vocal cords under direct vision, positive ETCO2 and breath sounds checked- equal and bilateral Tube secured with: Tape Dental Injury: Teeth and Oropharynx as per pre-operative assessment  Comments: Grade II view. Inserted Boujie stylet without difficulty. ETT passed over boujie. +/= BBS.

## 2020-11-28 NOTE — Anesthesia Postprocedure Evaluation (Signed)
Anesthesia Post Note  Patient: Gene Kim  Procedure(s) Performed: IMAGE GUIDED SINUS SURGERY (Nose) SEPTOPLASTY (Nose) MAXILLARY ANTROSTOMY WITH TISSUE REMOVAL (Bilateral: Nose) ETHMOIDECTOMY WITH FRONTAL SINUSES (Bilateral: Nose)     Patient location during evaluation: PACU Anesthesia Type: General Level of consciousness: awake and alert and oriented Pain management: satisfactory to patient Vital Signs Assessment: post-procedure vital signs reviewed and stable Respiratory status: spontaneous breathing, nonlabored ventilation and respiratory function stable Cardiovascular status: blood pressure returned to baseline and stable Postop Assessment: Adequate PO intake and No signs of nausea or vomiting Anesthetic complications: no   No notable events documented.  Raliegh Ip

## 2020-11-28 NOTE — H&P (Signed)
H&P has been reviewed and patient reevaluated, no changes necessary. To be downloaded later.  

## 2020-11-28 NOTE — Op Note (Signed)
11/28/2020  12:46 PM  SF:[id number]   Pre-Op Dx:  Deviated Nasal Septum, Hypertrophic Inferior Turbinates, chronic bilateral maxillary sinusitis, chronic bilateral ethmoid sinusitis, chronic bilateral frontal sinusitis  Post-op Dx: Same  Proc: Bilateral endoscopic maxillary antrostomies with removal of contents, bilateral endoscopic ethmoidectomy with frontal sinusotomy, nasal Septoplasty, Bilateral Partial Reduction Inferior Turbinates, use of image guided system.  Surg:  Gene Kim  Anes:  GOT  EBL: 100 mL  Comp: None  Findings: Extremely deviated septum to the left side anteriorly with the quadrangular plate buckled.  The maxillary crest was buckled over to the right.  He had large concha bullosa of both middle turbinates filled with mucus.  His maxillary sinuses are partially opacified with mucus.  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  He had mucous membrane thickening through much of his ethmoid sinuses and partial blockage of his frontal sinuses bilaterally.  Procedure: With the patient in a comfortable supine position,  general orotracheal anesthesia was induced without difficulty.     The patient received preoperative Afrin spray for topical decongestion and vasoconstriction.  Intravenous prophylactic antibiotics were administered.  The image guided system was brought in and the template was applied the face.  The CT scan was downloaded to the system and the template was registered to the system.  There is 0.5 cm of variance.  The suction instruments were then registered the system and this showed perfect alignment with the CT scan.  At an appropriate level, the patient was placed in a semi-sitting position.  Nasal vibrissae were trimmed.   1% Xylocaine with 1:100,000 epinephrine,  6.5 cc's, was infiltrated into the anterior floor of the nose, into the nasal spine region, into the membranous columella, and finally into the submucoperichondrial plane of the septum on both sides.   Several minutes were allowed for this to take effect.  Cottoniod pledgetts soaked in Afrin and 4% Xylocaine were placed into both nasal cavities and left while the patient was prepped and draped in the standard fashion.  The materials were removed from the nose and observed to be intact and correct in number.  The nose was inspected with a headlight and zero degree scope with the findings as described above.  A left hemitransfixion incision was sharply executed and carried down to the quadrangular cartilage. The mucoperichondrium was elelvated along the quadrangular plate back to the bony-cartilaginous junction on both sides.. The mucoperiostium was then elevated along the ethmoid plate and the vomer. The boney-catilaginous junction was then split with a freer elevator and the mucoperiosteum was elevated on the opposite side. The mucoperiosteum was then elevated along the maxillary crest as needed to expose the crooked bone of the crest.  Boney spurs of the vomer and maxillary crest were removed with Donavan Foil forceps.  A chisel was used to free up some of the crooked bone on the right side.  The cartilaginous plate was trimmed along its posterior and inferior borders of about 2 mm of cartilage to free it up inferiorly. Some of the deviated ethmoid plate was then fractured and removed with Takahashi forceps to free up the posterior border of the quadrangular plate and allow it to swing back to the midline. The mucosal flaps were placed back into their anatomic position to allow visualization of the airways. The septum now sat in the midline with an improved airway.  A 3-0 Chromic suture on a Keith needle in used to anchor the inferior septum at the nasal spine with a  through and through suture. The mucosal flaps are then sutured together using a through and through whip stitch of 4-0 Plain Gut with a mini-Keith needle. This was used to close the hemitransfixion incision as well.   The inferior turbinates  were then inspected. An incision was created along the inferior aspect of the left inferior turbinate with removal of some of the inferior soft tissue and bone. Electrocautery was used to control bleeding in the area. The remaining turbinate was then outfractured to open up the airway further. There was no significant bleeding noted. The right turbinate was then trimmed and outfractured in a similar fashion.  The 0 degree scope was then used to visualize the left nasal airway.  1 mL of 1% Xylocaine with epi 1: 100,000 was used for infiltration at the anterior and posterior roots of the middle turbinate.  This was done on both sides.  The left side was addressed first.  The middle turbinate was infractured.  It had a large concha bullosa and using a Freer the inferior border of the turbinate was opened and the lateral half of the concha bullosa was removed.  There is thick mucus inside of this that was suctioned clear.  The uncinate process was then trimmed from in front of the maxillary antrum all the way up to the anterior ethmoid.  The maxillary antrum was widened posteriorly and inferiorly as well as anteriorly to include the natural ostium.  There is thick mucus that was cleaned out from maxillary sinus once the antrum was widely open.  0 and 30 degrees scopes were used and the image guided system was used to evaluate the landmarks.  The ethmoid bulla was then opened and removed and this was filled with some thickened mucus.  The posterior ethmoid air cells were opened using the image guided system to evaluate the depth of dissection and make sure all the air cells were open.  The 30 degree scope was then used to open up more the middle and anterior ethmoid air cells.  There is a very anterior large agar nasi cell and then a large anterior ethmoid air cell as well.  These were opened and could clearly be seen.  The the opening the frontal sinus duct was found between the 2 and this was widened until the  frontal sinus duct was clearly opened and visualized.  The boss frontal sinus instruments were used to clean out the opening to make sure this was clear.  This completed opening of the total ethmoid up to the fovea ethmoidalis and the frontal sinus duct, and the maxillary sinus.  Cottonoid pledgets were placed here to help with vasoconstriction.  The right side was then visualized and again a small amount of local anesthesia was placed the anterior and posterior borders of the middle turbinate.  Middle turbinate was then medialized and again it was cut down the middle to remove the lateral half of it because there is a large concha bullosa.  This also was filled with thick clear mucus that was suctioned out.  The uncinate process was then removed.  This opened up the maxillary antrum and the antrum was widened posteriorly and inferiorly.  There is a large artery nasi cell and some 90 degree forceps were used to remove the walls of that to open up this area well.  Some mucus was suctioned from inside sinus and with 0 and 30 degree scope she could see that the sinus was now cleared.  The 0 degree  scope was used for opening up the posterior ethmoid air cell.  A microdebrider was used to help smooth the surfaces.  The image guided system was used to make sure all the posterior ethmoid air cells were opened and cleaned.  The dissection was carried into the middle ethmoid air cells and then the 30 degree scope was used to open up the anterior ethmoid air cells.  Again there was a large anterior ethmoid air cell and a type IV cell that were blocking part of the opening to the frontal sinus.  I was able to open these widely and then opened up between them to find the opening to the frontal sinus.  The boss frontal sinus instruments were used to widen this and the 0 and 70 degree scopes with the image guided system was used to make sure the sinus was opened fully.  The image guided system was used to make sure all of the  ethmoid sinuses were opened and the fovea ethmoidalis was cleaned off.  Cottonoid pledgets were placed here temporarily.  The left side was revisited and the sinuses were open and clean.  There is minimal bleeding.  The middle turbinate was medialized and.  Posi-sep C was then cut and placed into the ethmoid sinus opening.  It was then wetted and expanded to fill up the anterior and posterior ethmoid area.  Little more was placed to fill up all of the ethmoid and keep the middle turbinate medialized.  The right side was then visualized and again the sinuses were all open and clear with no sign of any further disease.  It was suctioned of all blood.  Posi-sep C was then placed into the ethmoid sinus and wetted to go it would expand and fill up the entire sinus.  The airways were then visualized and showed open passageways on both sides that were significantly improved compared to before surgery. There was no signifcant bleeding. Nasal splints were applied to both sides of the septum using Xomed 0.96m regular sized splints that were trimmed, and then held in position with a 3-0 Nylon through and through suture.  The patient was turned back over to anesthesia, and awakened, extubated, and taken to the PACU in satisfactory condition.  Dispo:   PACU to home  Plan: Ice, elevation, narcotic analgesia, steroid taper, and prophylactic antibiotics for the duration of indwelling nasal foreign bodies.  We will reevaluate the patient in the office in 6 days and remove the septal splints.  Return to work in 10 days, strenuous activities in two weeks.   PElon Kim 11/28/2020 12:46 PM

## 2020-11-29 ENCOUNTER — Encounter: Payer: Self-pay | Admitting: Otolaryngology

## 2020-12-02 ENCOUNTER — Encounter: Payer: Self-pay | Admitting: Otolaryngology

## 2020-12-02 LAB — SURGICAL PATHOLOGY

## 2020-12-02 MED ORDER — SODIUM CHLORIDE 0.9 % IR SOLN
Status: DC | PRN
  Administered 2020-11-28: 500 mL

## 2021-09-10 ENCOUNTER — Telehealth: Payer: Self-pay

## 2021-09-10 NOTE — Telephone Encounter (Signed)
LVM for pt to return my call.   Thanks,  Avyukt Cimo, CMA 

## 2021-09-12 ENCOUNTER — Telehealth: Payer: Self-pay

## 2021-09-12 ENCOUNTER — Other Ambulatory Visit: Payer: Self-pay

## 2021-09-12 DIAGNOSIS — Z1211 Encounter for screening for malignant neoplasm of colon: Secondary | ICD-10-CM

## 2021-09-12 MED ORDER — NA SULFATE-K SULFATE-MG SULF 17.5-3.13-1.6 GM/177ML PO SOLN: 1.0000 | Freq: Once | ORAL | 0 refills | Status: AC

## 2021-09-12 NOTE — Telephone Encounter (Signed)
CALLED PATIENT NO ANSWER LEFT VOICEMAIL FOR A CALL BACK  letter sent 

## 2021-09-12 NOTE — Telephone Encounter (Signed)
Gastroenterology Pre-Procedure Review  Request Date: 10/06/21 Requesting Physician: Dr. Vicente Males  PATIENT REVIEW QUESTIONS: The patient responded to the following health history questions as indicated:    1. Are you having any GI issues? no 2. Do you have a personal history of Polyps? no 3. Do you have a family history of Colon Cancer or Polyps? no 4. Diabetes Mellitus? no 5. Joint replacements in the past 12 months?no 6. Major health problems in the past 3 months?no 7. Any artificial heart valves, MVP, or defibrillator?no    MEDICATIONS & ALLERGIES:    Patient reports the following regarding taking any anticoagulation/antiplatelet therapy:   Plavix, Coumadin, Eliquis, Xarelto, Lovenox, Pradaxa, Brilinta, or Effient? no Aspirin? no  Patient confirms/reports the following medications:  Current Outpatient Medications  Medication Sig Dispense Refill   albuterol (PROVENTIL HFA;VENTOLIN HFA) 108 (90 Base) MCG/ACT inhaler Inhale 2 puffs into the lungs every 4 (four) hours as needed for wheezing or shortness of breath.     cephALEXin (KEFLEX) 500 MG capsule Take 1 capsule (500 mg total) by mouth 2 (two) times daily. 14 capsule 0   Fluticasone-Salmeterol (ADVAIR DISKUS) 100-50 MCG/DOSE AEPB Inhale 1 puff into the lungs 2 (two) times daily. 60 each 0   Ipratropium-Albuterol (COMBIVENT RESPIMAT) 20-100 MCG/ACT AERS respimat Inhale 1 puff into the lungs every 6 (six) hours. 1 Inhaler 0   ipratropium-albuterol (DUONEB) 0.5-2.5 (3) MG/3ML SOLN Inhale 3 mLs into the lungs every 6 (six) hours as needed.  2   predniSONE (DELTASONE) 10 MG tablet Start with 3 pills tomorrow. Taper over the next 6 days.  3,3,2,2,1,1. 12 tablet 0   No current facility-administered medications for this visit.    Patient confirms/reports the following allergies:  No Known Allergies  No orders of the defined types were placed in this encounter.   AUTHORIZATION INFORMATION Primary Insurance: 1D#: Group #:  Secondary  Insurance: 1D#: Group #:  SCHEDULE INFORMATION: Date: 10/06/21 Time: Location: [facility]

## 2021-10-06 ENCOUNTER — Ambulatory Visit
Admission: RE | Admit: 2021-10-06 | Discharge: 2021-10-06 | Disposition: A | Discharge status: Home / Self Care | Payer: Medicare Other | Attending: Gastroenterology | Admitting: Gastroenterology

## 2021-10-06 ENCOUNTER — Encounter: Payer: Self-pay | Admitting: Gastroenterology

## 2021-10-06 ENCOUNTER — Ambulatory Visit: Payer: Medicare Other | Admitting: Certified Registered Nurse Anesthetist

## 2021-10-06 ENCOUNTER — Encounter
Admission: RE | Disposition: A | Discharge status: Home / Self Care | Payer: Self-pay | Source: Home / Self Care | Attending: Gastroenterology

## 2021-10-06 DIAGNOSIS — Z1211 Encounter for screening for malignant neoplasm of colon: Secondary | ICD-10-CM | POA: Diagnosis present

## 2021-10-06 DIAGNOSIS — J449 Chronic obstructive pulmonary disease, unspecified: Secondary | ICD-10-CM | POA: Insufficient documentation

## 2021-10-06 DIAGNOSIS — K5730 Diverticulosis of large intestine without perforation or abscess without bleeding: Secondary | ICD-10-CM | POA: Insufficient documentation

## 2021-10-06 DIAGNOSIS — K6289 Other specified diseases of anus and rectum: Secondary | ICD-10-CM | POA: Insufficient documentation

## 2021-10-06 DIAGNOSIS — K529 Noninfective gastroenteritis and colitis, unspecified: Secondary | ICD-10-CM | POA: Diagnosis not present

## 2021-10-06 DIAGNOSIS — Z87891 Personal history of nicotine dependence: Secondary | ICD-10-CM | POA: Diagnosis not present

## 2021-10-06 DIAGNOSIS — D126 Benign neoplasm of colon, unspecified: Secondary | ICD-10-CM | POA: Diagnosis not present

## 2021-10-06 DIAGNOSIS — D120 Benign neoplasm of cecum: Secondary | ICD-10-CM | POA: Insufficient documentation

## 2021-10-06 HISTORY — PX: COLONOSCOPY WITH PROPOFOL: SHX5780

## 2021-10-06 SURGERY — COLONOSCOPY WITH PROPOFOL
Anesthesia: General

## 2021-10-06 MED ORDER — PROPOFOL 10 MG/ML IV BOLUS
INTRAVENOUS | Status: DC | PRN
  Administered 2021-10-06: 30 mg via INTRAVENOUS
  Administered 2021-10-06: 70 mg via INTRAVENOUS
  Administered 2021-10-06 (×2): 30 mg via INTRAVENOUS

## 2021-10-06 MED ORDER — LIDOCAINE HCL (CARDIAC) PF 100 MG/5ML IV SOSY
PREFILLED_SYRINGE | INTRAVENOUS | Status: DC | PRN
  Administered 2021-10-06: 50 mg via INTRAVENOUS

## 2021-10-06 MED ORDER — PROPOFOL 500 MG/50ML IV EMUL
INTRAVENOUS | Status: DC | PRN
  Administered 2021-10-06: 140 ug/kg/min via INTRAVENOUS

## 2021-10-06 MED ORDER — SODIUM CHLORIDE 0.9 % IV SOLN: INTRAVENOUS | Status: DC

## 2021-10-06 NOTE — Op Note (Signed)
[facility] Gastroenterology Patient Name: York Valliant Procedure Date: 10/06/2021 10:38 AM MRN: [id number] Account #: [id number] Date of Birth: 02-Mar-1951 Admit Type: Outpatient Age: 71 Room: [facility] ENDO ROOM 1 Gender: Male Note Status: Finalized Instrument Name: Park Meo [id number] Procedure:             Colonoscopy Indications:           Screening for colorectal malignant neoplasm Providers:             [name] MD, MD Medicines:             Monitored Anesthesia Care Complications:         No immediate complications. Procedure:             Pre-Anesthesia Assessment:                        - Prior to the procedure, a History and Physical was                         performed, and patient medications, allergies and                         sensitivities were reviewed. The patient's tolerance                         of previous anesthesia was reviewed.                        - The risks and benefits of the procedure and the                         sedation options and risks were discussed with the                         patient. All questions were answered and informed                         consent was obtained.                        After obtaining informed consent, the colonoscope was                         passed under direct vision. Throughout the procedure,                         the patient's blood pressure, pulse, and oxygen                         saturations were monitored continuously. The                         Colonoscope was introduced through the anus and                         advanced to the the cecum, identified by the                         appendiceal orifice. The colonoscopy was performed  with ease. The patient tolerated the procedure well.                         The quality of the bowel preparation was good. Findings:      The perianal and digital rectal examinations were normal.      A 3 mm polyp was  found in the cecum. The polyp was sessile. The polyp       was removed with a cold snare. Resection and retrieval were complete.      Multiple small and large-mouthed diverticula were found in the left       colon.      Localized moderate inflammation characterized by congestion (edema),       erythema and aphthous ulcerations was found in the distal rectum and in       the sigmoid colon. Biopsies were taken with a cold forceps for histology.      The exam was otherwise without abnormality on direct and retroflexion       views. Impression:            - One 3 mm polyp in the cecum, removed with a cold                         snare. Resected and retrieved.                        - Diverticulosis in the left colon.                        - Localized moderate inflammation was found in the                         distal rectum and in the sigmoid colon secondary to                         colitis. Biopsied.                        - The examination was otherwise normal on direct and                         retroflexion views. Recommendation:        - Discharge patient to home (with escort).                        - Resume previous diet.                        - Continue present medications.                        - Await pathology results.                        - Repeat colonoscopy for surveillance based on                         pathology results. Procedure Code(s):     --- Professional ---                        [phone], Colonoscopy, flexible;  with removal of                         tumor(s), polyp(s), or other lesion(s) by snare                         technique                        45380, 59, Colonoscopy, flexible; with biopsy, single                         or multiple Diagnosis Code(s):     --- Professional ---                        Z12.11, Encounter for screening for malignant neoplasm                         of colon                        K63.5, Polyp of colon                         K52.9, Noninfective gastroenteritis and colitis,                         unspecified                        K57.30, Diverticulosis of large intestine without                         perforation or abscess without bleeding CPT copyright 2019 [employer]. All rights reserved. The codes documented in this report are preliminary and upon coder review may  be revised to meet current compliance requirements. [name], MD [name] MD, MD 10/06/2021 11:05:37 AM This report has been signed electronically. Number of Addenda: 0 Note Initiated On: 10/06/2021 10:38 AM Scope Withdrawal Time: 0 hours 9 minutes 55 seconds  Total Procedure Duration: 0 hours 12 minutes 54 seconds  Estimated Blood Loss:  Estimated blood loss: none.      [facility]

## 2021-10-06 NOTE — Anesthesia Procedure Notes (Signed)
Date/Time: 10/06/2021 10:45 AM  Performed by: Lily Peer, Kaja Jackowski, CRNAPre-anesthesia Checklist: Patient identified, Emergency Drugs available, Suction available, Patient being monitored and Timeout performed Patient Re-evaluated:Patient Re-evaluated prior to induction Oxygen Delivery Method: Nasal cannula Induction Type: IV induction

## 2021-10-06 NOTE — Anesthesia Preprocedure Evaluation (Signed)
Anesthesia Evaluation  Patient identified by MRN, date of birth, ID band Patient awake    Reviewed: Allergy & Precautions, NPO status , Patient's Chart, lab work & pertinent test results  History of Anesthesia Complications Negative for: history of anesthetic complications  Airway Mallampati: III  TM Distance: >3 FB Neck ROM: Full    Dental  (+) Partial Upper   Pulmonary neg sleep apnea, COPD,  COPD inhaler, Patient abstained from smoking.Not current smoker, former smoker,     + decreased breath sounds      Cardiovascular Exercise Tolerance: Good METS(-) hypertension(-) CAD and (-) Past MI negative cardio ROS  (-) dysrhythmias  Rhythm:Regular Rate:Normal - Systolic murmurs    Neuro/Psych negative neurological ROS  negative psych ROS   GI/Hepatic neg GERD  ,(+)     (-) substance abuse  ,   Endo/Other  neg diabetes  Renal/GU negative Renal ROS     Musculoskeletal   Abdominal   Peds  Hematology   Anesthesia Other Findings Past Medical History: No date: COPD (chronic obstructive pulmonary disease) (HCC)  Reproductive/Obstetrics                             Anesthesia Physical Anesthesia Plan  ASA: 2  Anesthesia Plan: General   Post-op Pain Management: Minimal or no pain anticipated   Induction: Intravenous  PONV Risk Score and Plan: 2 and Propofol infusion, TIVA and Ondansetron  Airway Management Planned: Nasal Cannula  Additional Equipment: None  Intra-op Plan:   Post-operative Plan:   Informed Consent: I have reviewed the patients History and Physical, chart, labs and discussed the procedure including the risks, benefits and alternatives for the proposed anesthesia with the patient or authorized representative who has indicated his/her understanding and acceptance.     Dental advisory given  Plan Discussed with: CRNA and Surgeon  Anesthesia Plan Comments: (Discussed  risks of anesthesia with patient, including possibility of difficulty with spontaneous ventilation under anesthesia necessitating airway intervention, PONV, and rare risks such as cardiac or respiratory or neurological events, and allergic reactions. Discussed the role of CRNA in patient's perioperative care. Patient understands.)        Anesthesia Quick Evaluation

## 2021-10-06 NOTE — Transfer of Care (Signed)
Immediate Anesthesia Transfer of Care Note  Patient: Gene Kim  Procedure(s) Performed: COLONOSCOPY WITH PROPOFOL  Patient Location: Endoscopy Unit  Anesthesia Type:General  Level of Consciousness: drowsy  Airway & Oxygen Therapy: Patient Spontanous Breathing  Post-op Assessment: Report given to RN and Post -op Vital signs reviewed and stable  Post vital signs: Reviewed and stable  Last Vitals:  Vitals Value Taken Time  BP 107/61 10/06/21 1106  Temp 36.2 C 10/06/21 1105  Pulse 78 10/06/21 1106  Resp 17 10/06/21 1106  SpO2 100 % 10/06/21 1106  Vitals shown include unvalidated device data.  Last Pain:  Vitals:   10/06/21 1105  TempSrc: Temporal  PainSc: 0-No pain         Complications: No notable events documented.

## 2021-10-06 NOTE — H&P (Signed)
[name], MD [address] [place], [place], [place], [address] [address], [place], [place], [place], [address] Phone: [phone]  Fax: [phone]  Primary Care Physician:  [facility]   Pre-Procedure History & Physical: HPI:  Gene Kim is a 71 y.o. male is here for an colonoscopy.   Past Medical History:  Diagnosis Date   COPD (chronic obstructive pulmonary disease) ([facility])     Past Surgical History:  Procedure Laterality Date   ETHMOIDECTOMY Bilateral 11/28/2020   Procedure: ETHMOIDECTOMY WITH FRONTAL SINUSES;  Surgeon: Margaretha Sheffield, MD;  Location: [facility];  Service: ENT;  Laterality: Bilateral;   EXTERNAL EAR SURGERY Left    IMAGE GUIDED SINUS SURGERY N/A 11/28/2020   Procedure: IMAGE GUIDED SINUS SURGERY;  Surgeon: Margaretha Sheffield, MD;  Location: [facility];  Service: ENT;  Laterality: N/A;   MAXILLARY ANTROSTOMY Bilateral 11/28/2020   Procedure: MAXILLARY ANTROSTOMY WITH TISSUE REMOVAL;  Surgeon: Margaretha Sheffield, MD;  Location: [facility];  Service: ENT;  Laterality: Bilateral;   SEPTOPLASTY N/A 11/28/2020   Procedure: SEPTOPLASTY;  Surgeon: Margaretha Sheffield, MD;  Location: [facility];  Service: ENT;  Laterality: N/A;  PLACED DISK ON OR CHARGE NURSE DESK 8-17  KP   TONSILLECTOMY      Prior to Admission medications   Medication Sig Start Date End Date Taking? Authorizing Provider  albuterol (PROVENTIL HFA;VENTOLIN HFA) 108 (90 Base) MCG/ACT inhaler Inhale 2 puffs into the lungs every 4 (four) hours as needed for wheezing or shortness of breath.   Yes [provider]  cephALEXin (KEFLEX) 500 MG capsule Take 1 capsule (500 mg total) by mouth 2 (two) times daily. 11/28/20   Margaretha Sheffield, MD  Fluticasone-Salmeterol (ADVAIR DISKUS) 100-50 MCG/DOSE AEPB Inhale 1 puff into the lungs 2 (two) times daily. 05/27/15   Hower, Aaron Mose, MD  Ipratropium-Albuterol (COMBIVENT RESPIMAT) 20-100  MCG/ACT AERS respimat Inhale 1 puff into the lungs every 6 (six) hours. 08/28/14   Max Sane, MD  ipratropium-albuterol (DUONEB) 0.5-2.5 (3) MG/3ML SOLN Inhale 3 mLs into the lungs every 6 (six) hours as needed. 05/21/15   [provider]  predniSONE (DELTASONE) 10 MG tablet Start with 3 pills tomorrow. Taper over the next 6 days.  3,3,2,2,1,1. 11/28/20   Margaretha Sheffield, MD    Allergies as of 09/12/2021   (No Known Allergies)    Family History  Problem Relation Age of Onset   Heart disease Father     Social History   Socioeconomic History   Marital status: Single    Spouse name: Not on file   Number of children: Not on file   Years of education: Not on file   Highest education level: Not on file  Occupational History   Not on file  Tobacco Use   Smoking status: Former    Packs/day: 0.50    Types: Cigarettes   Smokeless tobacco: Never  Vaping Use   Vaping Use: Never used  Substance and Sexual Activity   Alcohol use: Yes    Alcohol/week: 35.0 standard drinks of alcohol    Types: 35 Cans of beer per week    Comment: rarely   Drug use: No   Sexual activity: Yes    Birth control/protection: None  Other Topics Concern   Not on file  Social History Narrative   Not on file   Social Determinants of Health   Financial Resource Strain: Not on file  Food Insecurity: Not on  file  Transportation Needs: Not on file  Physical Activity: Not on file  Stress: Not on file  Social Connections: Not on file  Intimate Partner Violence: Not on file    Review of Systems: See HPI, otherwise negative ROS  Physical Exam: BP 131/80   Pulse 77   Temp (!) 96.3 F (35.7 C) (Temporal)   Resp 20   Ht '5\' 9"'$  (1.753 m)   Wt 63.5 kg   SpO2 99%   BMI 20.67 kg/m  General:   Alert,  pleasant and cooperative in NAD Head:  Normocephalic and atraumatic. Neck:  Supple; no masses or thyromegaly. Lungs:  Clear throughout to auscultation, normal respiratory effort.    Heart:  +S1, +S2,  Regular rate and rhythm, No edema. Abdomen:  Soft, nontender and nondistended. Normal bowel sounds, without guarding, and without rebound.   Neurologic:  Alert and  oriented x4;  grossly normal neurologically.  Impression/Plan: Gene Kim is here for an colonoscopy to be performed for Screening colonoscopy average risk   Risks, benefits, limitations, and alternatives regarding  colonoscopy have been reviewed with the patient.  Questions have been answered.  All parties agreeable.   [name], MD  10/06/2021, 10:36 AM

## 2021-10-06 NOTE — Anesthesia Postprocedure Evaluation (Signed)
Anesthesia Post Note  Patient: Gene Kim  Procedure(s) Performed: COLONOSCOPY WITH PROPOFOL  Patient location during evaluation: Endoscopy Anesthesia Type: General Level of consciousness: awake and alert Pain management: pain level controlled Vital Signs Assessment: post-procedure vital signs reviewed and stable Respiratory status: spontaneous breathing, nonlabored ventilation, respiratory function stable and patient connected to nasal cannula oxygen Cardiovascular status: blood pressure returned to baseline and stable Postop Assessment: no apparent nausea or vomiting Anesthetic complications: no   No notable events documented.   Last Vitals:  Vitals:   10/06/21 1115 10/06/21 1125  BP: (!) 146/86 (!) 151/86  Pulse: 78 80  Resp: (!) 22 14  Temp:    SpO2: 98% 100%    Last Pain:  Vitals:   10/06/21 1125  TempSrc:   PainSc: 0-No pain                 Arita Miss

## 2021-10-07 ENCOUNTER — Encounter: Payer: Self-pay | Admitting: Gastroenterology

## 2021-10-07 LAB — SURGICAL PATHOLOGY

## 2021-10-07 NOTE — Progress Notes (Signed)
Needs office visit to discuss results - am free next Wednesday afternoon to set him up

## 2021-10-08 ENCOUNTER — Telehealth: Payer: Self-pay

## 2021-10-08 NOTE — Telephone Encounter (Signed)
-----   Message from [name], MD sent at 10/07/2021 10:24 AM EDT ----- Needs office visit to discuss results - am free next Wednesday afternoon to set him up

## 2021-10-08 NOTE — Telephone Encounter (Signed)
Called patient but had to leave him a message to please call me back. Patient needs an appointment with Dr. Vicente Males next Wednesday 10/15/2021 at 3:15 PM.

## 2021-10-09 NOTE — Telephone Encounter (Signed)
Patient called and I told him that Dr. Vicente Males wanted to see him to go over his colonoscopy results. Patient agreed and will come in next week.

## 2021-10-15 ENCOUNTER — Encounter: Payer: Self-pay | Admitting: Gastroenterology

## 2021-10-15 ENCOUNTER — Other Ambulatory Visit: Payer: Self-pay

## 2021-10-15 ENCOUNTER — Ambulatory Visit (INDEPENDENT_AMBULATORY_CARE_PROVIDER_SITE_OTHER): Payer: Medicare Other | Admitting: Gastroenterology

## 2021-10-15 VITALS — BP 119/75 | HR 116 | Temp 98.7°F | Ht 69.0 in | Wt 137.0 lb

## 2021-10-15 DIAGNOSIS — B182 Chronic viral hepatitis C: Secondary | ICD-10-CM | POA: Insufficient documentation

## 2021-10-15 DIAGNOSIS — K5130 Ulcerative (chronic) rectosigmoiditis without complications: Secondary | ICD-10-CM | POA: Diagnosis not present

## 2021-10-15 MED ORDER — MESALAMINE 1.2 G PO TBEC
2.4000 g | DELAYED_RELEASE_TABLET | Freq: Two times a day (BID) | ORAL | 0 refills | Status: DC
Start: 2021-10-15 — End: 2021-11-04

## 2021-10-15 NOTE — Addendum Note (Signed)
Addended by: Wayna Chalet on: 10/15/2021 04:27 PM   Modules accepted: Orders

## 2021-10-15 NOTE — Progress Notes (Signed)
c   [name] MD, MRCP(U.K) [address]  [place]  [place], [place] [address]  Main: [phone]  Fax: [phone]   Gastroenterology Consultation  Referring Provider:     [facility] Physician:  [facility] Primary Gastroenterologist:  Dr. [name]  Reason for Consultation:     Discuss results of recent colonoscopy        HPI:   Gene Kim is a 71 y.o. y/o male here to discuss the results of his recent colonoscopy.  He underwent a colonoscopy on 10/06/2021 for colon cancer screening.  A 3 mm polyp in the cecum was resected diverticulosis of the colon was noted inflammation was noted in the rectum and sigmoid colon suggestive of colitis biopsies were taken.  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   He states that he has had ulcerative colitis for over 15 years he used to be seen by physicians at [facility].  Unfortunately we did not have the information with [place] prior to his colonoscopy.  Looking back into the chart his last office visit with them was in 2021.  He says he has only been on suppositories although his last office note was to start on Lialda.  He has had Canasa suppositories as well.  Denies any surveillance colonoscopy for dysplasia.  No recent use of steroids.  Ulcerative colitis runs in his family.  Prior notes indicate mostly inflammation in the sigmoid colon and rectum.  Presently not on any medications.  He says he has on and off flares and then uses Canasa suppositories as needed.  Past Medical History:  Diagnosis Date   COPD (chronic obstructive pulmonary disease) ([facility])     Past Surgical History:  Procedure Laterality Date   COLONOSCOPY WITH PROPOFOL N/A 10/06/2021   Procedure: COLONOSCOPY WITH PROPOFOL;  Surgeon: [name], MD;  [facility] ENDOSCOPY;  Service:  Gastroenterology;  Laterality: N/A;   ETHMOIDECTOMY Bilateral 11/28/2020   Procedure: ETHMOIDECTOMY WITH FRONTAL SINUSES;  Surgeon: Margaretha Sheffield, MD;  Location: [facility];  Service: ENT;  Laterality: Bilateral;   EXTERNAL EAR SURGERY Left    IMAGE GUIDED SINUS SURGERY N/A 11/28/2020   Procedure: IMAGE GUIDED SINUS SURGERY;  Surgeon: Margaretha Sheffield, MD;  Location: [facility];  Service: ENT;  Laterality: N/A;   MAXILLARY ANTROSTOMY Bilateral 11/28/2020   Procedure: MAXILLARY ANTROSTOMY WITH TISSUE REMOVAL;  Surgeon: Margaretha Sheffield, MD;  Location: [facility];  Service: ENT;  Laterality: Bilateral;   SEPTOPLASTY N/A 11/28/2020   Procedure: SEPTOPLASTY;  Surgeon: Margaretha Sheffield, MD;  Location: [facility];  Service: ENT;  Laterality: N/A;  PLACED DISK ON OR CHARGE NURSE DESK 8-17  KP   TONSILLECTOMY      Prior to Admission medications   Medication Sig Start Date End Date Taking? Authorizing Provider  albuterol (PROVENTIL HFA;VENTOLIN HFA) 108 (90 Base) MCG/ACT inhaler Inhale 2 puffs into the lungs every 4 (four) hours as needed for wheezing or shortness of breath.    [provider]  cephALEXin (KEFLEX) 500 MG capsule Take 1 capsule (500 mg total) by mouth 2 (two) times daily. 11/28/20   Margaretha Sheffield, MD  Fluticasone-Salmeterol (ADVAIR DISKUS) 100-50 MCG/DOSE AEPB Inhale 1 puff into the lungs 2 (two) times daily. 05/27/15   Hower, Aaron Mose, MD  Ipratropium-Albuterol (COMBIVENT RESPIMAT) 20-100 MCG/ACT AERS respimat Inhale 1 puff into the  lungs every 6 (six) hours. 08/28/14   Max Sane, MD  ipratropium-albuterol (DUONEB) 0.5-2.5 (3) MG/3ML SOLN Inhale 3 mLs into the lungs every 6 (six) hours as needed. 05/21/15   [provider]  predniSONE (DELTASONE) 10 MG tablet Start with 3 pills tomorrow. Taper over the next 6 days.  3,3,2,2,1,1. 11/28/20   Margaretha Sheffield, MD    Family History  Problem Relation Age of Onset   Heart disease Father      Social  History   Tobacco Use   Smoking status: Former    Packs/day: 0.50    Types: Cigarettes   Smokeless tobacco: Never  Vaping Use   Vaping Use: Never used  Substance Use Topics   Alcohol use: Yes    Alcohol/week: 35.0 standard drinks of alcohol    Types: 35 Cans of beer per week    Comment: rarely   Drug use: No    Allergies as of 10/15/2021   (No Known Allergies)    Review of Systems:    All systems reviewed and negative except where noted in HPI.   Physical Exam:  There were no vitals taken for this visit. No LMP for male patient. Psych:  Alert and cooperative. Normal mood and affect. General:   Alert,  Well-developed, well-nourished, pleasant and cooperative in NAD Head:  Normocephalic and atraumatic. Eyes:  Sclera clear, no icterus.   Conjunctiva pink. Ears:  Normal auditory acuity..    Neurologic:  Alert and oriented x3;  grossly normal neurologically. Psych:  Alert and cooperative. Normal mood and affect.  Imaging Studies: No results found.  Assessment and Plan:   Kolsen Choe is a 71 y.o. y/o male here today to see me to discuss results of his recent colonoscopy which was performed colorectal cancer screening.  In addition to a sessile polyp that was resected incidental finding of proctoscopy sigmoiditis was noted with biopsies confirming chronic active colitis.  Very suspicious for ulcerative colitis.  No recent labs.  When we discussed further today he does confirm that he has a diagnosis of ulcerative colitis previously managed at [facility] never used any medications on a regular basis was only using Canasa suppositories although his last note from 2021 at [facility] suggest that he should be using Lialda does not appear that he ever did do that.  Has not had a surveillance colonoscopy.  Plan 1.  CBC, CMP, CRP, fecal calprotectin 2.   We will commence on Lialda 2.4 g twice daily or equivalent and perform colonoscopy in 6 months to assess disease activity as well as  take biopsies for dysplasia surveillance as he has had ulcerative colitis for more than 8 years. 3.  At next visit we will discuss health maintenance 4.  At next visit we will also check labs to ensure no elevation in LFTs or agranulocytosis.      Follow up in 2 to 3 months  Dr [name] MD,MRCP(U.K)

## 2021-10-16 ENCOUNTER — Ambulatory Visit: Payer: Medicare Other | Admitting: Gastroenterology

## 2021-10-16 LAB — CBC WITH DIFFERENTIAL/PLATELET
Basophils Absolute: 0 10*3/uL (ref 0.0–0.2)
Basos: 1 %
EOS (ABSOLUTE): 0.3 10*3/uL (ref 0.0–0.4)
Eos: 6 %
Hematocrit: 44.2 % (ref 37.5–51.0)
Hemoglobin: 15.2 g/dL (ref 13.0–17.7)
Immature Grans (Abs): 0 10*3/uL (ref 0.0–0.1)
Immature Granulocytes: 0 %
Lymphocytes Absolute: 1.8 10*3/uL (ref 0.7–3.1)
Lymphs: 35 %
MCH: 32.3 pg (ref 26.6–33.0)
MCHC: 34.4 g/dL (ref 31.5–35.7)
MCV: 94 fL (ref 79–97)
Monocytes Absolute: 0.7 10*3/uL (ref 0.1–0.9)
Monocytes: 13 %
Neutrophils Absolute: 2.3 10*3/uL (ref 1.4–7.0)
Neutrophils: 45 %
Platelets: 272 10*3/uL (ref 150–450)
RBC: 4.7 x10E6/uL (ref 4.14–5.80)
RDW: 12.5 % (ref 11.6–15.4)
WBC: 5.1 10*3/uL (ref 3.4–10.8)

## 2021-10-16 LAB — COMPREHENSIVE METABOLIC PANEL
ALT: 50 IU/L — ABNORMAL HIGH (ref 0–44)
AST: 66 IU/L — ABNORMAL HIGH (ref 0–40)
Albumin/Globulin Ratio: 1.9 (ref 1.2–2.2)
Albumin: 4.5 g/dL (ref 3.9–4.9)
Alkaline Phosphatase: 102 IU/L (ref 44–121)
BUN/Creatinine Ratio: 11 (ref 10–24)
BUN: 11 mg/dL (ref 8–27)
Bilirubin Total: 0.6 mg/dL (ref 0.0–1.2)
CO2: 20 mmol/L (ref 20–29)
Calcium: 9.2 mg/dL (ref 8.6–10.2)
Chloride: 103 mmol/L (ref 96–106)
Creatinine, Ser: 0.99 mg/dL (ref 0.76–1.27)
Globulin, Total: 2.4 g/dL (ref 1.5–4.5)
Glucose: 174 mg/dL — ABNORMAL HIGH (ref 70–99)
Potassium: 4.5 mmol/L (ref 3.5–5.2)
Sodium: 143 mmol/L (ref 134–144)
Total Protein: 6.9 g/dL (ref 6.0–8.5)
eGFR: 82 mL/min/{1.73_m2} (ref >59)

## 2021-10-16 LAB — C-REACTIVE PROTEIN: CRP: <1 mg/L (ref 0–10)

## 2021-10-16 NOTE — Progress Notes (Signed)
Inform LFT's are mildly raised - get full viral and autoimmune hepatitis work up , RUQ USG

## 2021-10-31 ENCOUNTER — Telehealth: Payer: Self-pay | Admitting: Gastroenterology

## 2021-10-31 NOTE — Telephone Encounter (Signed)
Patients sister left vm stating that the patient needs a refill on mesalamine and the pharmacy tells them that they have been trying to get in touch with [place]. Patients sister requesting a call back.

## 2021-11-03 NOTE — Telephone Encounter (Signed)
Patient called again for medication refill.

## 2021-11-04 MED ORDER — MESALAMINE 1.2 G PO TBEC: 2.4000 g | DELAYED_RELEASE_TABLET | Freq: Two times a day (BID) | ORAL | 2 refills | Status: DC

## 2021-11-04 NOTE — Addendum Note (Signed)
Addended by: Ulyess Blossom L on: 11/04/2021 10:53 AM   Modules accepted: Orders

## 2021-11-04 NOTE — Telephone Encounter (Signed)
Called patient and left a detail message that medication was called in to [facility]

## 2021-11-05 ENCOUNTER — Telehealth: Payer: Self-pay

## 2021-11-05 ENCOUNTER — Other Ambulatory Visit: Payer: Self-pay

## 2021-11-05 DIAGNOSIS — R748 Abnormal levels of other serum enzymes: Secondary | ICD-10-CM

## 2021-11-05 NOTE — Telephone Encounter (Signed)
Called patient but had to leave him a detailed message letting him know that his LFTs were elevated and that Dr. Vicente Males wanted him to do some additional labs and an ultrasound. Labs and ultrasound were ordered. Patient is to come to the office to have his labs drawn or go to [facility] and for his ultrasound, he is to go to the [facility], arrive 30 minutes prior and nothing to eat or drink after midnight the night before.

## 2021-11-05 NOTE — Progress Notes (Signed)
usg

## 2021-11-05 NOTE — Addendum Note (Signed)
Addended by: Wayna Chalet on: 11/05/2021 11:47 AM   Modules accepted: Orders

## 2021-11-05 NOTE — Telephone Encounter (Signed)
-----   Message from [name], MD sent at 10/16/2021 10:30 AM EDT ----- Inform LFT's are mildly raised - get full viral and autoimmune hepatitis work up , RUQ USG

## 2021-11-07 NOTE — Telephone Encounter (Signed)
Called patient and was not able to leave a voicemail. I then  called his sister-Dana to let her know that I have been trying to get in contact with her brother and he had not returned my call. Therefore, I left her the message that Dr. Vicente Males wanted her brother to have additional labs and an ultrasound. I gave the information and if she had further questions, to please call me back.

## 2021-11-07 NOTE — Telephone Encounter (Signed)
Hinton Dyer called back and I gave her the information about her brother and about the labs and ultrasound. She stated that she would take care of it.

## 2021-11-13 LAB — HEPATITIS A ANTIBODY, TOTAL: hep A Total Ab: POSITIVE — AB

## 2021-11-13 LAB — ANTI-SMOOTH MUSCLE ANTIBODY, IGG: Smooth Muscle Ab: 5 Units (ref 0–19)

## 2021-11-13 LAB — HEPATITIS C ANTIBODY: Hep C Virus Ab: REACTIVE — AB

## 2021-11-13 LAB — IRON,TIBC AND FERRITIN PANEL
Ferritin: 166 ng/mL (ref 30–400)
Iron Saturation: 84 % (ref 15–55)
Iron: 276 ug/dL (ref 38–169)
Total Iron Binding Capacity: 330 ug/dL (ref 250–450)
UIBC: 54 ug/dL — ABNORMAL LOW (ref 111–343)

## 2021-11-13 LAB — HEPATITIS B SURFACE ANTIGEN: Hepatitis B Surface Ag: NEGATIVE

## 2021-11-13 LAB — HEPATITIS B SURFACE ANTIBODY,QUALITATIVE: Hep B Surface Ab, Qual: REACTIVE

## 2021-11-13 LAB — MITOCHONDRIAL ANTIBODIES: Mitochondrial Ab: <20 Units (ref 0.0–20.0)

## 2021-11-13 LAB — CERULOPLASMIN: Ceruloplasmin: 13.7 mg/dL — ABNORMAL LOW (ref 16.0–31.0)

## 2021-11-13 LAB — ANA: Anti Nuclear Antibody (ANA): NEGATIVE

## 2021-11-13 LAB — ALPHA-1-ANTITRYPSIN: A-1 Antitrypsin: 100 mg/dL — ABNORMAL LOW (ref 101–187)

## 2021-11-14 ENCOUNTER — Ambulatory Visit
Admission: RE | Admit: 2021-11-14 | Discharge: 2021-11-14 | Disposition: A | Discharge status: Home / Self Care | Payer: Medicare Other | Source: Ambulatory Visit | Attending: Gastroenterology | Admitting: Gastroenterology

## 2021-11-14 DIAGNOSIS — R748 Abnormal levels of other serum enzymes: Secondary | ICD-10-CM | POA: Insufficient documentation

## 2021-12-29 ENCOUNTER — Encounter: Payer: Self-pay | Admitting: Gastroenterology

## 2021-12-29 ENCOUNTER — Ambulatory Visit (INDEPENDENT_AMBULATORY_CARE_PROVIDER_SITE_OTHER): Payer: Medicare Other | Admitting: Gastroenterology

## 2021-12-29 VITALS — BP 133/72 | HR 100 | Temp 98.4°F | Ht 69.0 in | Wt 138.0 lb

## 2021-12-29 DIAGNOSIS — R748 Abnormal levels of other serum enzymes: Secondary | ICD-10-CM | POA: Diagnosis not present

## 2021-12-29 DIAGNOSIS — Z23 Encounter for immunization: Secondary | ICD-10-CM

## 2021-12-29 DIAGNOSIS — R768 Other specified abnormal immunological findings in serum: Secondary | ICD-10-CM

## 2021-12-29 DIAGNOSIS — E8801 Alpha-1-antitrypsin deficiency: Secondary | ICD-10-CM

## 2021-12-29 DIAGNOSIS — H18049 Kayser-Fleischer ring, unspecified eye: Secondary | ICD-10-CM

## 2021-12-29 DIAGNOSIS — K5130 Ulcerative (chronic) rectosigmoiditis without complications: Secondary | ICD-10-CM | POA: Diagnosis not present

## 2021-12-29 NOTE — Addendum Note (Signed)
Addended by: Jacqualin Combes on: 12/29/2021 02:42 PM   Modules accepted: Orders

## 2021-12-29 NOTE — Progress Notes (Signed)
[name] MD, MRCP(U.K) [address]  [place]  [place], [place] [address]  Main: [phone]  Fax: [phone]   Primary Care Physician: [facility]  Primary Gastroenterologist:  Dr. [name]   Chief Complaint  Patient presents with   Follow-up    HPI: Gene Kim is a 71 y.o. male   Summary of history :  Initially referred and seen on 10/15/2021 to discuss recent colonoscopy which was performed for colon cancer screening on October 06, 2021.A 3 mm polyp in the cecum was resected diverticulosis of the colon was noted inflammation was noted in the rectum and sigmoid colon suggestive of colitis biopsies were taken.  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     He states that he has had ulcerative colitis for over 15 years he used to be seen by physicians at [facility].  Unfortunately we did not have the information with [place] prior to his colonoscopy.  Looking back into the chart his last office visit with them was in 2021.  He says he has only been on suppositories although his last office note was to start on Lialda.  He has had Canasa suppositories as well.  Denies any surveillance colonoscopy for dysplasia.  No recent use of steroids.  Ulcerative colitis runs in his family.  Prior notes indicate mostly inflammation in the sigmoid colon and rectum.  Presently not on any medications.  He says he has on and off flares and then uses Canasa suppositories as needed.    Interval history 10/15/2021-12/29/2021  10/15/2021: Hemoglobin 15.2 AST 66 ALT 50, albumin 4.5 CRP less than 1.  11/10/2021: Iron percentage saturation 84 and ferritin 166 ceruloplasmin low at 13.7.  Hepatitis B surface antibody reactive hep C virus antibody positive smooth muscle antibody negative alpha-1 antitrypsin low hepatitis A total antibody positive ANA  negative 11/14/2021 hepatic steatosis   Since he started the Lialda he says that he has had no blood in his stools no diarrhea and regular formed bowel movements feels much better.  He has had a flu shot but not had a pneumococcal shot not had a zoster shot.  History of hepatitis C he recollects he was treated with Harvoni and cured.  No other complaints.  Current Outpatient Medications  Medication Sig Dispense Refill   albuterol (PROVENTIL HFA;VENTOLIN HFA) 108 (90 Base) MCG/ACT inhaler Inhale 2 puffs into the lungs every 4 (four) hours as needed for wheezing or shortness of breath.     Ipratropium-Albuterol (COMBIVENT RESPIMAT) 20-100 MCG/ACT AERS respimat Inhale 1 puff into the lungs every 6 (six) hours. 1 Inhaler 0   ipratropium-albuterol (DUONEB) 0.5-2.5 (3) MG/3ML SOLN Inhale 3 mLs into the lungs every 6 (six) hours as needed.  2   mesalamine (LIALDA) 1.2 g EC tablet Take 2 tablets (2.4 g total) by mouth 2 (two) times daily with a meal. 120 tablet 2   mometasone-formoterol (DULERA) 100-5 MCG/ACT AERO Inhale 2 puffs into the lungs in the morning and at bedtime.     Multiple Vitamin (DAILY VITAMINS) tablet Take 1 tablet by mouth daily.     predniSONE (DELTASONE) 10 MG tablet Start with 3 pills tomorrow. Taper over the next 6 days.  3,3,2,2,1,1. 12 tablet 0   cephALEXin (KEFLEX) 500 MG capsule Take 1 capsule (500 mg total) by mouth 2 (two) times daily. (Patient not taking: Reported on 12/29/2021)  14 capsule 0   No current facility-administered medications for this visit.    Allergies as of 12/29/2021 - Review Complete 12/29/2021  Allergen Reaction Noted   Pollen extract  04/27/2013    ROS:  General: Negative for anorexia, weight loss, fever, chills, fatigue, weakness. ENT: Negative for hoarseness, difficulty swallowing , nasal congestion. CV: Negative for chest pain, angina, palpitations, dyspnea on exertion, peripheral edema.  Respiratory: Negative for dyspnea at rest, dyspnea on  exertion, cough, sputum, wheezing.  GI: See history of present illness. GU:  Negative for dysuria, hematuria, urinary incontinence, urinary frequency, nocturnal urination.  Endo: Negative for unusual weight change.    Physical Examination:   BP 133/72   Pulse 100   Temp 98.4 F (36.9 C) (Oral)   Ht '5\' 9"'$  (1.753 m)   Wt 138 lb (62.6 kg)   BMI 20.38 kg/m   General: Well-nourished, well-developed in no acute distress.  Eyes: No icterus. Conjunctivae pink.  With Neuro: Alert and oriented x 3.  Grossly intact. Skin: Warm and dry, no jaundice.   Psych: Alert and cooperative, normal mood and affect.   Imaging Studies: No results found.  Assessment and Plan:   Gene Kim is a 71 y.o. y/o male here to follow-up for ulcerative colitis.  He has had it for 15 years not really followed up with gastroenterology being on any medication on a regular basis.  At some point of time and you can also suppositories.  No prior colonoscopy for surveillance of dysplasia.    Plan 1.  Check 24-hour urinary copper, refer to ophthalmology for evaluation of KF rings, check alpha-1 antitrypsin genotype.  Hemochromatosis gene.  Hepatitis C viral load since antibody is positive 2.   Continue on Lialda clinically in remission no evidence of inflammation on his labs.  Will perform colonoscopy in February 2023 for dysplasia surveillance 3.  Request for zoster shot as he is over 50 immunocompromised to be obtained at his local pharmacy, pneumococcal vaccine will be given today.  Advised to obtain Tdap shot at his PCP.  Has received his flu shot earlier .     Dr [name]  MD,MRCP [facility]) Follow up in 16 weeks

## 2022-01-05 LAB — ALPHA-1-ANTITRYPSIN PHENOTYP: A-1 Antitrypsin: 107 mg/dL (ref 101–187)

## 2022-01-05 LAB — HEMOCHROMATOSIS DNA-PCR(C282Y,H63D)

## 2022-01-05 LAB — HCV REALTIME ABBOTT: Hepatitis C Quantitation: <12 IU/mL

## 2022-01-19 ENCOUNTER — Telehealth: Payer: Self-pay

## 2022-01-19 NOTE — Telephone Encounter (Signed)
Patient has eye exam scheduled at [facility] on 03/03/22 '@1'$ :30 pm.  ThanksSharyn Lull, [place]

## 2022-02-02 LAB — COPPER, URINE - RANDOM OR 24 HOUR
Copper / Creatinine Ratio: 8 ug/g creat (ref 0–49)
Copper, 24H Ur: 13 ug/24 hr (ref 3–35)
Copper, Ur: 12 ug/L
Creatinine(Crt),U: 1.5 g/L (ref 0.30–3.00)

## 2022-03-25 ENCOUNTER — Other Ambulatory Visit: Payer: Self-pay

## 2022-03-25 ENCOUNTER — Emergency Department: Payer: 59

## 2022-03-25 ENCOUNTER — Encounter: Payer: Self-pay | Admitting: *Deleted

## 2022-03-25 ENCOUNTER — Emergency Department
Admission: EM | Admit: 2022-03-25 | Discharge: 2022-03-25 | Disposition: A | Discharge status: Home / Self Care | Payer: 59 | Attending: Emergency Medicine | Admitting: Emergency Medicine

## 2022-03-25 DIAGNOSIS — F10920 Alcohol use, unspecified with intoxication, uncomplicated: Secondary | ICD-10-CM

## 2022-03-25 DIAGNOSIS — Y908 Blood alcohol level of 240 mg/100 ml or more: Secondary | ICD-10-CM | POA: Insufficient documentation

## 2022-03-25 DIAGNOSIS — W06XXXA Fall from bed, initial encounter: Secondary | ICD-10-CM | POA: Insufficient documentation

## 2022-03-25 DIAGNOSIS — F10129 Alcohol abuse with intoxication, unspecified: Secondary | ICD-10-CM | POA: Diagnosis not present

## 2022-03-25 DIAGNOSIS — Y92480 Sidewalk as the place of occurrence of the external cause: Secondary | ICD-10-CM | POA: Diagnosis not present

## 2022-03-25 DIAGNOSIS — S0990XA Unspecified injury of head, initial encounter: Secondary | ICD-10-CM | POA: Insufficient documentation

## 2022-03-25 DIAGNOSIS — W19XXXA Unspecified fall, initial encounter: Secondary | ICD-10-CM

## 2022-03-25 LAB — BASIC METABOLIC PANEL
Anion gap: 14 (ref 5–15)
BUN: 9 mg/dL (ref 8–23)
CO2: 21 mmol/L — ABNORMAL LOW (ref 22–32)
Calcium: 8.7 mg/dL — ABNORMAL LOW (ref 8.9–10.3)
Chloride: 98 mmol/L (ref 98–111)
Creatinine, Ser: 0.67 mg/dL (ref 0.61–1.24)
GFR, Estimated: >60 mL/min (ref >60)
Glucose, Bld: 103 mg/dL — ABNORMAL HIGH (ref 70–99)
Potassium: 4.5 mmol/L (ref 3.5–5.1)
Sodium: 133 mmol/L — ABNORMAL LOW (ref 135–145)

## 2022-03-25 LAB — CBC
HCT: 44.9 % (ref 39.0–52.0)
Hemoglobin: 15.2 g/dL (ref 13.0–17.0)
MCH: 30.3 pg (ref 26.0–34.0)
MCHC: 33.9 g/dL (ref 30.0–36.0)
MCV: 89.4 fL (ref 80.0–100.0)
Platelets: 365 10*3/uL (ref 150–400)
RBC: 5.02 MIL/uL (ref 4.22–5.81)
RDW: 13 % (ref 11.5–15.5)
WBC: 6.3 10*3/uL (ref 4.0–10.5)
nRBC: 0 % (ref 0.0–0.2)

## 2022-03-25 LAB — ETHANOL: Alcohol, Ethyl (B): 358 mg/dL (ref <10)

## 2022-03-25 LAB — TROPONIN I (HIGH SENSITIVITY): Troponin I (High Sensitivity): 3 ng/L (ref <18)

## 2022-03-25 NOTE — ED Triage Notes (Signed)
First nurse note: Pt to ED via EMS c/o ETOH picked [place] from sidewalk [place].  Per EMS pt "passed out" and fell into mulch bed, was alert and ambulatory upon EMS arrival.  Hx of anxiety, VSS 98% RA, 98 HR, 141/94 BP, drank 6-7 beers.

## 2022-03-25 NOTE — ED Triage Notes (Signed)
Pt brought in via ems.  Pt states he was walking and passed out.  Pt reports he drank 4 or 5 beers today.  Pt intoxicated.  Slurring his words in triage.  Pt denies any pain.

## 2022-03-25 NOTE — ED Provider Notes (Signed)
[facility] Provider Note    Event Date/Time   First MD Initiated Contact with Patient 03/25/22 2227     (approximate)   History   Loss of Consciousness (f)   HPI  Gene Kim is a 72 y.o. male   Past medical history of chronic hepatitis C, alcohol use, presents to the emergency department with a fall while intoxicated.  He was walking home from the bar after drinking beers when he felt wobbly and fell.  He denies chest pain palpitations head strike or injury.  He awoke and now feels at baseline with no acute medical complaints.  No preceding illnesses.  No pain.   Denies drug use. History was obtained via the patient.      Physical Exam   Triage Vital Signs: ED Triage Vitals  Enc Vitals Group     BP 03/25/22 2059 (!) 120/95     Pulse Rate 03/25/22 2059 (!) 103     Resp 03/25/22 2059 18     Temp 03/25/22 2059 98 F (36.7 C)     Temp Source 03/25/22 2059 Oral     SpO2 03/25/22 2059 95 %     Weight 03/25/22 2056 136 lb 11 oz (62 kg)     Height 03/25/22 2056 '5\' 9"'$  (1.753 m)     Head Circumference --      Peak Flow --      Pain Score 03/25/22 2056 0     Pain Loc --      Pain Edu? --      Excl. in [place]? --     Most recent vital signs: Vitals:   03/25/22 2059  BP: (!) 120/95  Pulse: (!) 103  Resp: 18  Temp: 98 F (36.7 C)  SpO2: 95%    General: Awake, no distress.  CV:  Good peripheral perfusion.  Resp:  Normal effort.  Abd:  No distention.  Other:  Awake alert, slurred speech appears intoxicated.  He is ambulatory in the emergency department.  No obvious signs of trauma.  Moving all extremities   ED Results / Procedures / Treatments   Labs (all labs ordered are listed, but only abnormal results are displayed) Labs Reviewed  BASIC METABOLIC PANEL - Abnormal; Notable for the following components:      Result Value   Sodium 133 (*)    CO2 21 (*)    Glucose, Bld 103 (*)    Calcium 8.7 (*)    All other components  within normal limits  ETHANOL - Abnormal; Notable for the following components:   Alcohol, Ethyl (B) 358 (*)    All other components within normal limits  CBC  TROPONIN I (HIGH SENSITIVITY)     I reviewed labs and they are notable for alcohol level is markedly elevated 350s.  EKG  ED ECG REPORT I, Lucillie Garfinkel, the attending physician, personally viewed and interpreted this ECG.   Date: 03/25/2022  EKG Time: 2100  Rate: 95  Rhythm: normal sinus rhythm  Axis: nl  Intervals:none  ST&T Change: No acute ischemic changes or malignant dysrhythmias    PROCEDURES:  Critical Care performed: No  Procedures   MEDICATIONS ORDERED IN ED: Medications - No data to display   IMPRESSION / MDM / [place] / ED COURSE  I reviewed the triage vital signs and the nursing notes.  Differential diagnosis includes, but is not limited to, alcohol intoxication, syncope, dysrhythmia, ACS, CVA  MDM: This patient is intoxicated and had a fall, questionable syncope but EKG is normal and initial troponin negative, he denies any other acute medical complaints and there are no focal neurologic deficits.  I think his fall was due to his alcohol intoxication.  Will CT his head to assess for intracranial bleeding from his fall, and his sister here in the emergency department as a safe ride for him to go home if this scan is negative.  Remainder his blood work is within normal limits.    Patient's presentation is most consistent with acute presentation with potential threat to life or bodily function.       FINAL CLINICAL IMPRESSION(S) / ED DIAGNOSES   Final diagnoses:  Alcoholic intoxication without complication ([place])  Fall, initial encounter     Rx / DC Orders   ED Discharge Orders     None        Note:  This document was prepared using Dragon voice recognition software and may include unintentional dictation errors.    Lucillie Garfinkel,  MD 03/25/22 2258

## 2022-03-25 NOTE — Discharge Instructions (Signed)
Thank you for choosing us for your health care today!  Please see your primary doctor this week for a follow up appointment.   Sometimes, in the early stages of certain disease courses it is difficult to detect in the emergency department evaluation -- so, it is important that you continue to monitor your symptoms and call your doctor right away or return to the emergency department if you develop any new or worsening symptoms.  Please go to the following website to schedule new (and existing) patient appointments:   [email or web address]  If you do not have a primary doctor try calling the following clinics to establish care:  If you have insurance:  [facility] [phone] [address] [place] [address]   [facility]  [phone] [address] [place] [address] [place] [address]   If you do not have insurance:  [facility]  [phone] [address] [place] [address]   The following is another list of primary care offices in the area who are accepting new patients at this time.  Please reach out to one of them directly and let them know you would like to schedule an appointment to follow up on an Emergency Department visit, and/or to establish a new primary care provider (PCP).  There are likely other primary care clinics in the are who are accepting new patients, but this is an excellent place to start:  [facility] Lead physician: Dr Angela Bacigalupo [address] [place], [place] [address] [phone]  [facility] Lead Physician: Dr Krichna Sowles [address], , [place] [address] [phone]  [facility]  Lead Physician: Dr Megan Johnson [address], [place], [place] [address] [phone]  [facility] [place] [facility] Lead Physician: Dr Alex Karamalegos [address], [place], [place] [address] [phone]  [facility] [place] Lead Physician: Dr Laura Berglund [address], [place], [place] [address] [phone]   It was my pleasure to care for you today.   Janautica Netzley S. Claude Waldman, MD  

## 2022-03-25 NOTE — ED Provider Notes (Signed)
-----------------------------------------   11:14 PM on 03/25/2022 -----------------------------------------   CT head negative for ICH.  Patient ambulating with steady gait.  Family member at bedside to provide responsible ride home.  Strict return precautions given.  Both verbalized understanding and agree with plan of care.   Paulette Blanch, MD 03/26/22 [phone]

## 2022-05-04 ENCOUNTER — Ambulatory Visit (INDEPENDENT_AMBULATORY_CARE_PROVIDER_SITE_OTHER): Payer: 59 | Admitting: Gastroenterology

## 2022-05-04 ENCOUNTER — Encounter: Payer: Self-pay | Admitting: Gastroenterology

## 2022-05-04 VITALS — BP 140/70 | HR 92 | Temp 98.9°F | Ht 69.0 in | Wt 140.4 lb

## 2022-05-04 DIAGNOSIS — K5130 Ulcerative (chronic) rectosigmoiditis without complications: Secondary | ICD-10-CM | POA: Diagnosis not present

## 2022-05-04 DIAGNOSIS — R768 Other specified abnormal immunological findings in serum: Secondary | ICD-10-CM | POA: Diagnosis not present

## 2022-05-04 DIAGNOSIS — R748 Abnormal levels of other serum enzymes: Secondary | ICD-10-CM

## 2022-05-04 MED ORDER — NA SULFATE-K SULFATE-MG SULF 17.5-3.13-1.6 GM/177ML PO SOLN: 1.0000 | Freq: Once | ORAL | 0 refills | Status: AC

## 2022-05-04 MED ORDER — OMEPRAZOLE 40 MG PO CPDR: 40.0000 mg | DELAYED_RELEASE_CAPSULE | Freq: Every day | ORAL | 0 refills | Status: AC

## 2022-05-04 NOTE — Addendum Note (Signed)
Addended by: Jacqualin Combes on: 05/04/2022 04:57 PM   Modules accepted: Orders

## 2022-05-04 NOTE — Progress Notes (Signed)
[name] MD, MRCP(U.K) [address]  [place]  [place], [place] [address]  Main: [phone]  Fax: [phone]   Primary Care Physician: Letta Median, MD  Primary Gastroenterologist:  Dr. [name]   Chief Complaint  Patient presents with   Follow-up    HPI: Gene Kim is a 72 y.o. male   Summary of history :   Initially referred and seen on 10/15/2021 to discuss recent colonoscopy which was performed for colon cancer screening on October 06, 2021.A 3 mm polyp in the cecum was resected diverticulosis of the colon was noted inflammation was noted in the rectum and sigmoid colon suggestive of colitis biopsies were taken.  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     He states that he has had ulcerative colitis for over 15 years he used to be seen by physicians at [facility].  Unfortunately we did not have the information with [place] prior to his colonoscopy.  Looking back into the chart his last office visit with them was in 2021.  He says he has only been on suppositories although his last office note was to start on Lialda.  He has had Canasa suppositories as well.  Denies any surveillance colonoscopy for dysplasia.  No recent use of steroids.  Ulcerative colitis runs in his family.  Prior notes indicate mostly inflammation in the sigmoid colon and rectum.  Presently not on any medications.  He says he has on and off flares and then uses Canasa suppositories as needed.   10/15/2021: Hemoglobin 15.2 AST 66 ALT 50, albumin 4.5 CRP less than 1.   11/10/2021: Iron percentage saturation 84 and ferritin 166 ceruloplasmin low at 13.7.  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 HCV RNA not detected. Normal A1AT levels. Negative gene for hemochromatosis .   03/25/2022: ER visit : alcohol intoxication and fall.    Been taking 4 tablets daily day 1-2 bowel movements a day nonbloody no diarrhea no urgency no nocturnal symptoms trying to cut down alcohol intake.  Had an eye exam was told it was normal. Complains of a lot of gas and bloating drinks a lot of sweet tea with his meals  Current Outpatient Medications  Medication Sig Dispense Refill   albuterol (PROVENTIL HFA;VENTOLIN HFA) 108 (90 Base) MCG/ACT inhaler Inhale 2 puffs into the lungs every 4 (four) hours as needed for wheezing or shortness of breath.     Ipratropium-Albuterol (COMBIVENT RESPIMAT) 20-100 MCG/ACT AERS respimat Inhale 1 puff into the lungs every 6 (six) hours. 1 Inhaler 0   ipratropium-albuterol (DUONEB) 0.5-2.5 (3) MG/3ML SOLN Inhale 3 mLs into the lungs every 6 (six) hours as needed.  2   mesalamine (LIALDA) 1.2 g EC tablet Take 2 tablets (2.4 g total) by mouth 2 (two) times daily with a meal. 120 tablet 2   mometasone-formoterol (DULERA) 100-5 MCG/ACT AERO Inhale 2 puffs into the lungs in the morning and at bedtime.     Multiple Vitamin (DAILY VITAMINS) tablet Take 1 tablet by mouth daily.     predniSONE (DELTASONE) 10 MG tablet Start with 3 pills tomorrow. Taper over the next 6 days.  3,3,2,2,1,1. 12 tablet 0  cephALEXin (KEFLEX) 500 MG capsule Take 1 capsule (500 mg total) by mouth 2 (two) times daily. (Patient not taking: Reported on 05/04/2022) 14 capsule 0   No current facility-administered medications for this visit.    Allergies as of 05/04/2022 - Review Complete 05/04/2022  Allergen Reaction Noted   Pollen extract  04/27/2013    ROS:  General: Negative for anorexia, weight loss, fever, chills, fatigue, weakness. ENT: Negative for hoarseness, difficulty swallowing , nasal congestion. CV: Negative for  chest pain, angina, palpitations, dyspnea on exertion, peripheral edema.  Respiratory: Negative for dyspnea at rest, dyspnea on exertion, cough, sputum, wheezing.  GI: See history of present illness. GU:  Negative for dysuria, hematuria, urinary incontinence, urinary frequency, nocturnal urination.  Endo: Negative for unusual weight change.    Physical Examination:   BP (!) 143/65   Pulse 89   Temp 98.9 F (37.2 C) (Oral)   Ht 5' 9"$  (1.753 m)   Wt 140 lb 6 oz (63.7 kg)   BMI 20.73 kg/m  Vitals:   05/04/22 1429  BP: (!) 143/65  Pulse: 89  Temp: 98.9 F (37.2 C)    General: Well-nourished, well-developed in no acute distress.  Eyes: No icterus. Conjunctivae pink. Mouth: Oropharyngeal mucosa moist and pink , no lesions erythema or exudate. Neuro: Alert and oriented x 3.  Grossly intact. Skin: Warm and dry, no jaundice.   Psych: Alert and cooperative, normal mood and affect.   Imaging Studies: No results found.  Assessment and Plan:   Gene Kim is a 72 y.o. y/o male here to follow-up for ulcerative colitis.  He has had it for 15 years not really followed up with gastroenterology being on any medication on a regular basis.  At some point of time and you can also suppositories.  No prior colonoscopy for surveillance of dysplasia.  H/o hep C S/p cured after being treated with Harvoni in the past .   Plan 1.   Continue on Lialda clinically in remission no evidence of inflammation on his labs.  Will perform colonoscopy for dysplasia surveillance 2.  Having a lot of gas and bloating consuming a lot of sweet tea advised to cut down if does not help to commence on 40 mG omeprazole once a day.  At next visit we will decide to stop it depending on how he responds 3.  Will address health maintenance next visit 4.  Advised to stop drinking excess alcohol  I have discussed alternative options, risks & benefits,  which include, but are not limited to, bleeding, infection,  perforation,respiratory complication & drug reaction.  The patient agrees with this plan & written consent will be obtained.    Dr [name]  MD,MRCP [facility]) Follow up in 3 months

## 2022-05-08 LAB — CBC WITH DIFFERENTIAL/PLATELET
Basophils Absolute: 0 10*3/uL (ref 0.0–0.2)
Basos: 1 %
EOS (ABSOLUTE): 0 10*3/uL (ref 0.0–0.4)
Eos: 1 %
Hematocrit: 44.6 % (ref 37.5–51.0)
Hemoglobin: 15.3 g/dL (ref 13.0–17.7)
Immature Grans (Abs): 0 10*3/uL (ref 0.0–0.1)
Immature Granulocytes: 0 %
Lymphocytes Absolute: 1.1 10*3/uL (ref 0.7–3.1)
Lymphs: 33 %
MCH: 31.5 pg (ref 26.6–33.0)
MCHC: 34.3 g/dL (ref 31.5–35.7)
MCV: 92 fL (ref 79–97)
Monocytes Absolute: 0.5 10*3/uL (ref 0.1–0.9)
Monocytes: 15 %
Neutrophils Absolute: 1.7 10*3/uL (ref 1.4–7.0)
Neutrophils: 50 %
Platelets: 201 10*3/uL (ref 150–450)
RBC: 4.86 x10E6/uL (ref 4.14–5.80)
RDW: 13.8 % (ref 11.6–15.4)
WBC: 3.3 10*3/uL — ABNORMAL LOW (ref 3.4–10.8)

## 2022-05-08 LAB — COMPREHENSIVE METABOLIC PANEL
ALT: 27 IU/L (ref 0–44)
AST: 39 IU/L (ref 0–40)
Albumin/Globulin Ratio: 1.9 (ref 1.2–2.2)
Albumin: 4.5 g/dL (ref 3.9–4.9)
Alkaline Phosphatase: 158 IU/L — ABNORMAL HIGH (ref 44–121)
BUN/Creatinine Ratio: 8 — ABNORMAL LOW (ref 10–24)
BUN: 7 mg/dL — ABNORMAL LOW (ref 8–27)
Bilirubin Total: 0.5 mg/dL (ref 0.0–1.2)
CO2: 22 mmol/L (ref 20–29)
Calcium: 9.2 mg/dL (ref 8.6–10.2)
Chloride: 93 mmol/L — ABNORMAL LOW (ref 96–106)
Creatinine, Ser: 0.83 mg/dL (ref 0.76–1.27)
Globulin, Total: 2.4 g/dL (ref 1.5–4.5)
Glucose: 110 mg/dL — ABNORMAL HIGH (ref 70–99)
Potassium: 4.1 mmol/L (ref 3.5–5.2)
Sodium: 130 mmol/L — ABNORMAL LOW (ref 134–144)
Total Protein: 6.9 g/dL (ref 6.0–8.5)
eGFR: 95 mL/min/{1.73_m2} (ref >59)

## 2022-05-08 LAB — PHOSPHATIDYLETHANOL (PETH)
Phosphatidylethanol (PEth): 1098 ng/mL
Phosphatidylethanol: POSITIVE — AB

## 2022-05-08 LAB — PROTIME-INR
INR: 1 (ref 0.9–1.2)
Prothrombin Time: 11 s (ref 9.1–12.0)

## 2022-05-08 LAB — C-REACTIVE PROTEIN: CRP: 2 mg/L (ref 0–10)

## 2022-05-19 ENCOUNTER — Encounter: Payer: Self-pay | Admitting: Gastroenterology

## 2022-05-20 ENCOUNTER — Encounter
Admission: RE | Disposition: A | Discharge status: Home / Self Care | Payer: Self-pay | Source: Home / Self Care | Attending: Gastroenterology

## 2022-05-20 ENCOUNTER — Ambulatory Visit: Payer: 59 | Admitting: Certified Registered Nurse Anesthetist

## 2022-05-20 ENCOUNTER — Ambulatory Visit
Admission: RE | Admit: 2022-05-20 | Discharge: 2022-05-20 | Disposition: A | Discharge status: Home / Self Care | Payer: 59 | Attending: Gastroenterology | Admitting: Gastroenterology

## 2022-05-20 DIAGNOSIS — K621 Rectal polyp: Secondary | ICD-10-CM | POA: Diagnosis not present

## 2022-05-20 DIAGNOSIS — J449 Chronic obstructive pulmonary disease, unspecified: Secondary | ICD-10-CM | POA: Insufficient documentation

## 2022-05-20 DIAGNOSIS — D126 Benign neoplasm of colon, unspecified: Secondary | ICD-10-CM | POA: Diagnosis not present

## 2022-05-20 DIAGNOSIS — Z1211 Encounter for screening for malignant neoplasm of colon: Secondary | ICD-10-CM | POA: Diagnosis not present

## 2022-05-20 DIAGNOSIS — Z87891 Personal history of nicotine dependence: Secondary | ICD-10-CM | POA: Diagnosis not present

## 2022-05-20 DIAGNOSIS — Z8711 Personal history of peptic ulcer disease: Secondary | ICD-10-CM | POA: Diagnosis not present

## 2022-05-20 DIAGNOSIS — K5130 Ulcerative (chronic) rectosigmoiditis without complications: Secondary | ICD-10-CM | POA: Diagnosis not present

## 2022-05-20 DIAGNOSIS — K5100 Ulcerative (chronic) pancolitis without complications: Secondary | ICD-10-CM | POA: Insufficient documentation

## 2022-05-20 HISTORY — PX: COLONOSCOPY WITH PROPOFOL: SHX5780

## 2022-05-20 SURGERY — COLONOSCOPY WITH PROPOFOL
Anesthesia: General

## 2022-05-20 MED ORDER — PHENYLEPHRINE HCL (PRESSORS) 10 MG/ML IV SOLN
INTRAVENOUS | Status: DC | PRN
  Administered 2022-05-20: 160 ug via INTRAVENOUS

## 2022-05-20 MED ORDER — PROPOFOL 10 MG/ML IV BOLUS
INTRAVENOUS | Status: DC | PRN
  Administered 2022-05-20: 80 mg via INTRAVENOUS
  Administered 2022-05-20: 20 mg via INTRAVENOUS
  Administered 2022-05-20: 50 mg via INTRAVENOUS

## 2022-05-20 MED ORDER — LIDOCAINE HCL (CARDIAC) PF 100 MG/5ML IV SOSY
PREFILLED_SYRINGE | INTRAVENOUS | Status: DC | PRN
  Administered 2022-05-20: 50 mg via INTRAVENOUS

## 2022-05-20 MED ORDER — DEXMEDETOMIDINE HCL IN NACL 80 MCG/20ML IV SOLN
INTRAVENOUS | Status: DC | PRN
  Administered 2022-05-20: 8 ug via BUCCAL

## 2022-05-20 MED ORDER — PROPOFOL 500 MG/50ML IV EMUL
INTRAVENOUS | Status: DC | PRN
  Administered 2022-05-20: 150 ug/kg/min via INTRAVENOUS

## 2022-05-20 MED ORDER — SODIUM CHLORIDE 0.9 % IV SOLN
INTRAVENOUS | Status: DC
  Administered 2022-05-20: 20 mL/h via INTRAVENOUS

## 2022-05-20 NOTE — H&P (Signed)
[name], MD [address] [place], [place], [place], [address] [address], [place], [place], [place], [address] Phone: [phone]  Fax: [phone]  Primary Care Physician:  Letta Median, MD   Pre-Procedure History & Physical: HPI:  Gene Kim is a 72 y.o. male is here for an colonoscopy.   Past Medical History:  Diagnosis Date   COPD (chronic obstructive pulmonary disease) ([place])     Past Surgical History:  Procedure Laterality Date   COLONOSCOPY WITH PROPOFOL N/A 10/06/2021   Procedure: COLONOSCOPY WITH PROPOFOL;  Surgeon: [name], MD;  Location: [facility] ENDOSCOPY;  Service: Gastroenterology;  Laterality: N/A;   ETHMOIDECTOMY Bilateral 11/28/2020   Procedure: ETHMOIDECTOMY WITH FRONTAL SINUSES;  Surgeon: Margaretha Sheffield, MD;  Location: [facility];  Service: ENT;  Laterality: Bilateral;   EXTERNAL EAR SURGERY Left    IMAGE GUIDED SINUS SURGERY N/A 11/28/2020   Procedure: IMAGE GUIDED SINUS SURGERY;  Surgeon: Margaretha Sheffield, MD;  Location: [facility];  Service: ENT;  Laterality: N/A;   MAXILLARY ANTROSTOMY Bilateral 11/28/2020   Procedure: MAXILLARY ANTROSTOMY WITH TISSUE REMOVAL;  Surgeon: Margaretha Sheffield, MD;  Location: [place];  Service: ENT;  Laterality: Bilateral;   SEPTOPLASTY N/A 11/28/2020   Procedure: SEPTOPLASTY;  Surgeon: Margaretha Sheffield, MD;  Location: [facility];  Service: ENT;  Laterality: N/A;  PLACED DISK ON OR CHARGE NURSE DESK 8-17  KP   TONSILLECTOMY      Prior to Admission medications   Medication Sig Start Date End Date Taking? Authorizing Provider  albuterol (PROVENTIL HFA;VENTOLIN HFA) 108 (90 Base) MCG/ACT inhaler Inhale 2 puffs into the lungs every 4 (four) hours as needed for wheezing or shortness of breath.    [provider]  cephALEXin (KEFLEX) 500 MG capsule Take 1 capsule (500 mg total) by mouth 2 (two) times daily. Patient not taking: Reported on 05/04/2022 11/28/20    Margaretha Sheffield, MD  Ipratropium-Albuterol (COMBIVENT RESPIMAT) 20-100 MCG/ACT AERS respimat Inhale 1 puff into the lungs every 6 (six) hours. 08/28/14   Max Sane, MD  ipratropium-albuterol (DUONEB) 0.5-2.5 (3) MG/3ML SOLN Inhale 3 mLs into the lungs every 6 (six) hours as needed. 05/21/15   [provider]  mesalamine (LIALDA) 1.2 g EC tablet Take 2 tablets (2.4 g total) by mouth 2 (two) times daily with a meal. 11/04/21   [name], MD  mometasone-formoterol [facility]) 100-5 MCG/ACT AERO Inhale 2 puffs into the lungs in the morning and at bedtime. 06/12/15   [provider]  Multiple Vitamin (DAILY VITAMINS) tablet Take 1 tablet by mouth daily.    [provider]  omeprazole (PRILOSEC) 40 MG capsule Take 1 capsule (40 mg total) by mouth daily. 05/04/22   [name], MD  predniSONE (DELTASONE) 10 MG tablet Start with 3 pills tomorrow. Taper over the next 6 days.  3,3,2,2,1,1. 11/28/20   Margaretha Sheffield, MD    Allergies as of 05/04/2022 - Review Complete 05/04/2022  Allergen Reaction Noted   Pollen extract  04/27/2013    Family History  Problem Relation Age of Onset   Heart disease Father     Social History   Socioeconomic History   Marital status: Single    Spouse name: Not on file   Number of children: Not on file   Years of education: Not on file   Highest education level: Not on file  Occupational History   Not on file  Tobacco Use   Smoking status: Former  Packs/day: 0.50    Types: Cigarettes   Smokeless tobacco: Never  Vaping Use   Vaping Use: Never used  Substance and Sexual Activity   Alcohol use: Yes    Alcohol/week: 35.0 standard drinks of alcohol    Types: 35 Cans of beer per week    Comment: rarely   Drug use: No   Sexual activity: Yes    Birth control/protection: None  Other Topics Concern   Not on file  Social History Narrative   Not on file   Social Determinants of Health   Financial Resource Strain: Not on file  Food  Insecurity: Not on file  Transportation Needs: Not on file  Physical Activity: Not on file  Stress: Not on file  Social Connections: Not on file  Intimate Partner Violence: Not on file    Review of Systems: See HPI, otherwise negative ROS  Physical Exam: There were no vitals taken for this visit. General:   Alert,  pleasant and cooperative in NAD Head:  Normocephalic and atraumatic. Neck:  Supple; no masses or thyromegaly. Lungs:  Clear throughout to auscultation, normal respiratory effort.    Heart:  +S1, +S2, Regular rate and rhythm, No edema. Abdomen:  Soft, nontender and nondistended. Normal bowel sounds, without guarding, and without rebound.   Neurologic:  Alert and  oriented x4;  grossly normal neurologically.  Impression/Plan: Gene Kim is here for an colonoscopy to be performed for dysplasia surveillance in ulcerative pan colitis > 8 years duration  Risks, benefits, limitations, and alternatives regarding  colonoscopy have been reviewed with the patient.  Questions have been answered.  All parties agreeable.   [name], MD  05/20/2022, 8:58 AM

## 2022-05-20 NOTE — Anesthesia Procedure Notes (Signed)
Date/Time: 05/20/2022 9:45 AM  Performed by: Johnna Acosta, CRNAPre-anesthesia Checklist: Patient identified, Emergency Drugs available, Suction available, Patient being monitored and Timeout performed Patient Re-evaluated:Patient Re-evaluated prior to induction Oxygen Delivery Method: Nasal cannula Preoxygenation: Pre-oxygenation with 100% oxygen Induction Type: IV induction

## 2022-05-20 NOTE — Anesthesia Postprocedure Evaluation (Signed)
Anesthesia Post Note  Patient: Gene Kim  Procedure(s) Performed: COLONOSCOPY WITH PROPOFOL  Patient location during evaluation: PACU Anesthesia Type: General Level of consciousness: awake and awake and alert Pain management: pain level controlled Vital Signs Assessment: post-procedure vital signs reviewed and stable Respiratory status: spontaneous breathing Cardiovascular status: stable Anesthetic complications: no   No notable events documented.   Last Vitals:  Vitals:   05/20/22 1015 05/20/22 1025  BP:    Pulse: 85 89  Resp:    Temp:    SpO2: 99% 100%    Last Pain:  Vitals:   05/20/22 1025  TempSrc:   PainSc: 0-No pain                 VAN STAVEREN,Delshon Blanchfield

## 2022-05-20 NOTE — Transfer of Care (Signed)
Immediate Anesthesia Transfer of Care Note  Patient: Gene Kim  Procedure(s) Performed: COLONOSCOPY WITH PROPOFOL  Patient Location: Endoscopy Unit  Anesthesia Type:General  Level of Consciousness: awake and alert   Airway & Oxygen Therapy: Patient Spontanous Breathing  Post-op Assessment: Report given to RN and Post -op Vital signs reviewed and stable  Post vital signs: Reviewed and stable  Last Vitals:  Vitals Value Taken Time  BP 126/77 05/20/22 1007  Temp 36.5 C 05/20/22 1005  Pulse 91 05/20/22 1007  Resp 19 05/20/22 1007  SpO2 98 % 05/20/22 1007  Vitals shown include unvalidated device data.  Last Pain:  Vitals:   05/20/22 1005  TempSrc: Temporal  PainSc: 0-No pain         Complications: No notable events documented.

## 2022-05-20 NOTE — Op Note (Signed)
[facility] Patient Name: Gene Kim Procedure Date: 05/20/2022 9:31 AM MRN: [id number] Account #: [id number] Date of Birth: 03/26/1950 Admit Type: Outpatient Age: 72 Room: [facility] ENDO ROOM 3 Gender: Male Note Status: Finalized Instrument Name: Jasper Riling [id number] Procedure:             Colonoscopy Indications:           High risk colon cancer surveillance: Ulcerative                         pancolitis of 8 (or more) years duration, Last                         colonoscopy: July 2023 Providers:             [name] MD, MD Medicines:             Monitored Anesthesia Care Complications:         No immediate complications. Procedure:             Pre-Anesthesia Assessment:                        - Prior to the procedure, a History and Physical was                         performed, and patient medications, allergies and                         sensitivities were reviewed. The patient's tolerance                         of previous anesthesia was reviewed.                        - The risks and benefits of the procedure and the                         sedation options and risks were discussed with the                         patient. All questions were answered and informed                         consent was obtained.                        - ASA Grade Assessment: II - A patient with mild                         systemic disease.                        After obtaining informed consent, the colonoscope was                         passed under direct vision. Throughout the procedure,                         the patient's blood pressure, pulse, and oxygen  saturations were monitored continuously. The                         Colonoscope was introduced through the anus and                         advanced to the the cecum, identified by the                         appendiceal orifice. The colonoscopy was performed                          with ease. The patient tolerated the procedure well.                         The quality of the bowel preparation was good. The                         ileocecal valve, appendiceal orifice, and rectum were                         photographed. Findings:      The perianal and digital rectal examinations were normal.      A 7 mm polyp was found in the rectum. The polyp was sessile. The polyp       was removed with a cold snare. Resection and retrieval were complete.      Normal mucosa was found in the entire colon. Two biopsies were taken       every 10 cm with a cold forceps from the entire colon for dysplasia       surveillance and ulcerative colitis surveillance. These biopsy specimens       from the entire colon were sent to Pathology.      The exam was otherwise without abnormality on direct and retroflexion       views. Impression:            - One 7 mm polyp in the rectum, removed with a cold                         snare. Resected and retrieved.                        - Normal mucosa in the entire examined colon. Biopsied.                        - The examination was otherwise normal on direct and                         retroflexion views. Recommendation:        - Discharge patient to home (with escort).                        - Resume previous diet.                        - Continue present medications.                        - Await pathology results.                        -  Repeat colonoscopy for surveillance based on                         pathology results. Procedure Code(s):     --- Professional ---                        [phone], Colonoscopy, flexible; with removal of                         tumor(s), polyp(s), or other lesion(s) by snare                         technique                        45380, 64, Colonoscopy, flexible; with biopsy, single                         or multiple Diagnosis Code(s):     --- Professional ---                        K51.00,  Ulcerative (chronic) pancolitis without                         complications                        D12.8, Benign neoplasm of rectum CPT copyright 2022 American Medical Association. All rights reserved. The codes documented in this report are preliminary and upon coder review may  be revised to meet current compliance requirements. [name], MD [name] MD, MD 05/20/2022 10:02:13 AM This report has been signed electronically. Number of Addenda: 0 Note Initiated On: 05/20/2022 9:31 AM Scope Withdrawal Time: 0 hours 10 minutes 49 seconds  Total Procedure Duration: 0 hours 12 minutes 27 seconds  Estimated Blood Loss:  Estimated blood loss: none.      [facility]

## 2022-05-20 NOTE — Anesthesia Preprocedure Evaluation (Signed)
Anesthesia Evaluation  Patient identified by MRN, date of birth, ID band Patient awake    Reviewed: Allergy & Precautions, NPO status , Patient's Chart, lab work & pertinent test results  Airway Mallampati: II  TM Distance: >3 FB Neck ROM: full    Dental  (+) Chipped, Dental Advisory Given   Pulmonary neg pulmonary ROS, COPD, Patient abstained from smoking., former smoker   Pulmonary exam normal  + decreased breath sounds      Cardiovascular Exercise Tolerance: Good negative cardio ROS Normal cardiovascular exam Rhythm:Regular     Neuro/Psych negative neurological ROS  negative psych ROS   GI/Hepatic negative GI ROS, Neg liver ROS, PUD,,,  Endo/Other  negative endocrine ROS    Renal/GU negative Renal ROS  negative genitourinary   Musculoskeletal   Abdominal Normal abdominal exam  (+)   Peds negative pediatric ROS (+)  Hematology negative hematology ROS (+)   Anesthesia Other Findings Past Medical History: No date: COPD (chronic obstructive pulmonary disease) ([place])  Past Surgical History: 10/06/2021: COLONOSCOPY WITH PROPOFOL; N/A     Comment:  Procedure: COLONOSCOPY WITH PROPOFOL;  Surgeon: [name], MD;  Location: [facility] ENDOSCOPY;  Service:               Gastroenterology;  Laterality: N/A; 11/28/2020: ETHMOIDECTOMY; Bilateral     Comment:  Procedure: ETHMOIDECTOMY WITH FRONTAL SINUSES;  Surgeon:              Margaretha Sheffield, MD;  Location: [place];                Service: ENT;  Laterality: Bilateral; No date: EXTERNAL EAR SURGERY; Left 11/28/2020: IMAGE GUIDED SINUS SURGERY; N/A     Comment:  Procedure: IMAGE GUIDED SINUS SURGERY;  Surgeon:               Margaretha Sheffield, MD;  Location: [place];                Service: ENT;  Laterality: N/A; 11/28/2020: MAXILLARY ANTROSTOMY; Bilateral     Comment:  Procedure: MAXILLARY ANTROSTOMY WITH TISSUE REMOVAL;                Surgeon:  Margaretha Sheffield, MD;  Location: [place];  Service: ENT;  Laterality: Bilateral; 11/28/2020: SEPTOPLASTY; N/A     Comment:  Procedure: SEPTOPLASTY;  Surgeon: Margaretha Sheffield, MD;                Location: [place];  Service: ENT;                Laterality: N/A;  PLACED DISK ON OR CHARGE NURSE DESK               8-17  KP No date: TONSILLECTOMY  BMI    Body Mass Index: 21.62 kg/m      Reproductive/Obstetrics negative OB ROS                             Anesthesia Physical Anesthesia Plan  ASA: 3  Anesthesia Plan: General   Post-op Pain Management:    Induction: Intravenous  PONV Risk Score and Plan: Propofol infusion and TIVA  Airway Management Planned: Natural Airway  Additional Equipment:   Intra-op Plan:   Post-operative Plan:   Informed Consent:  I have reviewed the patients History and Physical, chart, labs and discussed the procedure including the risks, benefits and alternatives for the proposed anesthesia with the patient or authorized representative who has indicated his/her understanding and acceptance.     Dental Advisory Given  Plan Discussed with: CRNA and Surgeon  Anesthesia Plan Comments:        Anesthesia Quick Evaluation

## 2022-05-20 NOTE — Anesthesia Procedure Notes (Signed)
Date/Time: 05/20/2022 9:41 AM  Performed by: Johnna Acosta, CRNAPre-anesthesia Checklist: Patient identified, Emergency Drugs available, Suction available, Patient being monitored and Timeout performed Patient Re-evaluated:Patient Re-evaluated prior to induction Oxygen Delivery Method: Nasal cannula Preoxygenation: Pre-oxygenation with 100% oxygen Induction Type: IV induction

## 2022-05-21 ENCOUNTER — Encounter: Payer: Self-pay | Admitting: Gastroenterology

## 2022-05-21 LAB — SURGICAL PATHOLOGY

## 2022-06-05 NOTE — Progress Notes (Signed)
Inform no evidence of inflammation on the biopsies- we will discuss further at office visit as per schedule

## 2022-06-09 ENCOUNTER — Telehealth: Payer: Self-pay

## 2022-06-09 NOTE — Telephone Encounter (Signed)
Patient verbalized understanding of results  

## 2022-06-09 NOTE — Telephone Encounter (Signed)
-----   Message from [name], MD sent at 06/05/2022  8:46 AM EDT ----- Inform no evidence of inflammation on the biopsies- we will discuss further at office visit as per schedule

## 2022-06-22 NOTE — Progress Notes (Signed)
Alkaline phos elevated : check GGT, PTH,Ca , vitamin D, stop drinking alcohol

## 2022-06-26 ENCOUNTER — Telehealth: Payer: Self-pay

## 2022-06-26 DIAGNOSIS — R748 Abnormal levels of other serum enzymes: Secondary | ICD-10-CM

## 2022-06-26 NOTE — Telephone Encounter (Signed)
-----   Message from Wyline Mood, MD sent at 06/22/2022  1:13 PM EDT ----- Alkaline phos elevated : check GGT, PTH,Ca , vitamin D, stop drinking alcohol

## 2022-06-26 NOTE — Telephone Encounter (Signed)
Called patient to let him know that his alkaline phosphate level was elevated, therefore, I let him know that he needed additional labs. Patient was offered to come in to our office or to go to [facility] to have them drawn. Patient understood and stated that he would come to our office to have them drawn.

## 2022-11-11 ENCOUNTER — Other Ambulatory Visit: Payer: Self-pay

## 2022-11-11 MED ORDER — MESALAMINE 1.2 G PO TBEC: 2.4000 g | DELAYED_RELEASE_TABLET | Freq: Two times a day (BID) | ORAL | 3 refills | Status: DC

## 2022-12-08 ENCOUNTER — Encounter: Payer: Self-pay | Admitting: Family Medicine

## 2022-12-10 ENCOUNTER — Telehealth: Payer: Self-pay | Admitting: *Deleted

## 2022-12-10 NOTE — Telephone Encounter (Signed)
Patient called office because we received a referral for a colonoscopy. However, patient did had a colonoscopy on 05/20/2022 with Dr Tobi Bastos. It was recommended for patient to schedule an office visit to further discuss. But patient decline on scheduling an appointment at this time.

## 2023-02-25 ENCOUNTER — Telehealth: Payer: Self-pay | Admitting: Gastroenterology

## 2023-02-25 ENCOUNTER — Telehealth: Payer: Self-pay

## 2023-02-25 NOTE — Telephone Encounter (Signed)
Patient called stating that he currently takes Mesalamine tablets but wanted to know if he could get the suppositories instead since he feels that the pills are not helping him. He stated that he had done the suppositories before given by [facility] and he stated that they worked Adult nurse. Please advise.

## 2023-02-25 NOTE — Telephone Encounter (Signed)
Patient called in left a voicemail requesting to speak to Dr. Tobi Bastos nurse. I called the patient back to inform her we receive her message, and I sent the message to the nurse.

## 2023-02-26 NOTE — Telephone Encounter (Signed)
Called patient back to let him know what Dr. Tobi Bastos stated below and patient stated that he would want to come in and see him because he wanted to talk to the doctor. Appointment was scheduled for 03/01/2023 at 1:30 PM.

## 2023-03-01 ENCOUNTER — Ambulatory Visit (INDEPENDENT_AMBULATORY_CARE_PROVIDER_SITE_OTHER): Payer: 59 | Admitting: Gastroenterology

## 2023-03-01 ENCOUNTER — Encounter: Payer: Self-pay | Admitting: Gastroenterology

## 2023-03-01 VITALS — BP 134/86 | HR 103 | Temp 98.6°F | Wt 147.6 lb

## 2023-03-01 DIAGNOSIS — K5130 Ulcerative (chronic) rectosigmoiditis without complications: Secondary | ICD-10-CM | POA: Diagnosis not present

## 2023-03-01 MED ORDER — MESALAMINE 1000 MG RE SUPP: 1000.0000 mg | Freq: Every day | RECTAL | 11 refills | Status: AC

## 2023-03-01 MED ORDER — MESALAMINE 1.2 G PO TBEC: 2.4000 g | DELAYED_RELEASE_TABLET | Freq: Two times a day (BID) | ORAL | 11 refills | Status: AC

## 2023-03-01 NOTE — Progress Notes (Signed)
Wyline Mood MD, MRCP(U.K) [address]  [address]  [place], [place] [address]  Main: [phone]  Fax: [phone]   Primary Care Physician: Oswaldo Conroy, MD  Primary Gastroenterologist:  Dr. Wyline Mood   Chief Complaint  Patient presents with   ulcerative rectosigmoiditis    HPI: Gene Kim is a 72 y.o. male Summary of history :   Initially referred and seen on 10/15/2021 to discuss recent colonoscopy which was performed for colon cancer screening on October 06, 2021.A 3 mm polyp in the cecum was resected diverticulosis of the colon was noted inflammation was noted in the rectum and sigmoid colon suggestive of colitis biopsies were taken.  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     He states that he has had ulcerative colitis for over 15 years he used to be seen by physicians at [facility].  Unfortunately we did not have the information with [place] prior to his colonoscopy.  Looking back into the chart his last office visit with them was in 2021.  He says he has only been on suppositories although his last office note was to start on Lialda.  He has had Canasa suppositories as well.  Denies any surveillance colonoscopy for dysplasia.  No recent use of steroids.  Ulcerative colitis runs in his family.  Prior notes indicate mostly inflammation in the sigmoid colon and rectum.  Presently not on any medications.  He says he has on and off flares and then uses Canasa suppositories as needed.     11/10/2021: Iron percentage saturation 84 and ferritin 166 ceruloplasmin low at 13.7.  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 HCV RNA not detected.  Normal A1AT levels. Negative gene for hemochromatosis .    Interval history 05/04/2022-03/01/2023    05/20/2022: Colonoscopy : 7 mm polyp in rectum- hyperplastic and normal appearing mucosa entire colon and biopsies showed no colitis    12/01/2022: LFT's normal  03/25/2022: ER visit : alcohol intoxication and fall.   He was previously prescribed Lialda 4 tablets a day.  It appears he has not been taking his medication regularly and has recently developed 2 watery bowel movements per day on and off some blood.  Denies any NSAID use but has taken on and off on a few occasions continues to drink alcohol.   No other complaints. Current Outpatient Medications  Medication Sig Dispense Refill   albuterol (PROVENTIL HFA;VENTOLIN HFA) 108 (90 Base) MCG/ACT inhaler Inhale 2 puffs into the lungs every 4 (four) hours as needed for wheezing or shortness of breath.     cephALEXin (KEFLEX) 500 MG capsule Take 1 capsule (500 mg total) by mouth 2 (two) times daily. 14 capsule 0   Ipratropium-Albuterol (COMBIVENT RESPIMAT) 20-100 MCG/ACT AERS respimat Inhale 1 puff into the lungs every 6 (six) hours. 1 Inhaler 0   ipratropium-albuterol (DUONEB) 0.5-2.5 (3) MG/3ML SOLN Inhale 3 mLs into the lungs every 6 (six) hours as needed.  2   mesalamine (LIALDA) 1.2 g EC tablet Take 2 tablets (2.4 g total) by mouth 2 (two) times daily with a meal. 120 tablet 3   mometasone-formoterol (DULERA) 100-5 MCG/ACT AERO Inhale 2 puffs into the lungs in the morning and at bedtime.  Multiple Vitamin (DAILY VITAMINS) tablet Take 1 tablet by mouth daily.     omeprazole (PRILOSEC) 40 MG capsule Take 1 capsule (40 mg total) by mouth daily. 90 capsule 0   predniSONE (DELTASONE) 10 MG tablet Start with 3 pills tomorrow. Taper over the next 6 days.  3,3,2,2,1,1. 12 tablet 0   No current facility-administered medications for this visit.    Allergies as of 03/01/2023 - Review Complete 03/01/2023  Allergen Reaction Noted   Pollen extract   04/27/2013     ROS:  General: Negative for anorexia, weight loss, fever, chills, fatigue, weakness. ENT: Negative for hoarseness, difficulty swallowing , nasal congestion. CV: Negative for chest pain, angina, palpitations, dyspnea on exertion, peripheral edema.  Respiratory: Negative for dyspnea at rest, dyspnea on exertion, cough, sputum, wheezing.  GI: See history of present illness. GU:  Negative for dysuria, hematuria, urinary incontinence, urinary frequency, nocturnal urination.  Endo: Negative for unusual weight change.    Physical Examination:   BP 134/86   Pulse (!) 103   Temp 98.6 F (37 C) (Oral)   Wt 147 lb 9.6 oz (67 kg)   BMI 21.80 kg/m   General: Well-nourished, well-developed in no acute distress.  Eyes: No icterus. Conjunctivae pink. Mouth: Oropharyngeal mucosa moist and pink , no lesions erythema or exudate. Neuro: Alert and oriented x 3.  Grossly intact. Skin: Warm and dry, no jaundice.   Psych: Alert and cooperative, normal mood and affect.   Imaging Studies: No results found.  Assessment and Plan:   Gene Kim is a 72 y.o. y/o male here to follow-up for ulcerative colitis.  He has had it for 15 years not really followed up with gastroenterology being on any medication on a regular basis.  At some point of time he has been on suppositories.  Colonoscopy in July 2023 showed inflammation in the rectum and sigmoid colon.  In March 2024 shows remission histologically after treating with Lialda.  H/o hep C S/p cured after being treated with Harvoni in the past .  Immune to hepatitis A and B.  He has not been regular to taking his medications recently and it may explain the recent change in his bowel habits.  Plan 1.   Continue on Lialda, in remission both clinically and endoscopy clearly we will perform colonoscopy in 2 years for dysplasia surveillance 2.  Strongly advised to restart his Lialda and take it on a daily basis as advised in addition  can take mesalamine suppositories which I will give him a refill for.  I have advised him in 2 weeks time if he continues to have looser than expected stools or any blood to send me a MyChart message then I would rule out infection with stool studies fecal calprotectin and CRP and set him up for a flexible sigmoidoscopy to rule out active colitis which she has been in remission as per his last colonoscopy in March 2024 3.  Needs Shingrix at local pharmacy 4.  Advised to stop drinking excess alcohol    Dr Wyline Mood  MD,MRCP [facility]) Follow up in 6 months

## 2023-03-01 NOTE — Patient Instructions (Signed)
Please let Dr. Tonia Ghent know that you are due for the Shigrix vaccine. She is able to provide it at her office.

## 2023-03-03 NOTE — Telephone Encounter (Signed)
Patient had an appointment scheduled to be seen by Dr. Tobi Bastos and his concerns were answered at his visit.
